# Patient Record
Sex: Female | Born: 1975
Health system: Southern US, Community
[De-identification: ages and names within clinical notes are randomized; demographics above are authoritative.]

## PROBLEM LIST (undated history)

## (undated) DIAGNOSIS — D219 Benign neoplasm of connective and other soft tissue, unspecified: Secondary | ICD-10-CM

## (undated) DIAGNOSIS — M0520 Rheumatoid vasculitis with rheumatoid arthritis of unspecified site: Secondary | ICD-10-CM

## (undated) DIAGNOSIS — Z8669 Personal history of other diseases of the nervous system and sense organs: Secondary | ICD-10-CM

## (undated) DIAGNOSIS — N83209 Unspecified ovarian cyst, unspecified side: Secondary | ICD-10-CM

## (undated) HISTORY — DX: Benign neoplasm of connective and other soft tissue, unspecified: D21.9

## (undated) HISTORY — DX: Rheumatoid vasculitis with rheumatoid arthritis of unspecified site: M05.20

## (undated) HISTORY — DX: Unspecified ovarian cyst, unspecified side: N83.209

## (undated) HISTORY — DX: Personal history of other diseases of the nervous system and sense organs: Z86.69

## (undated) HISTORY — PX: WISDOM TOOTH EXTRACTION: SHX21

## (undated) HISTORY — PX: COLPOSCOPY: SHX161

---

## 2014-04-20 DIAGNOSIS — Z79899 Other long term (current) drug therapy: Secondary | ICD-10-CM | POA: Insufficient documentation

## 2014-04-20 DIAGNOSIS — M0579 Rheumatoid arthritis with rheumatoid factor of multiple sites without organ or systems involvement: Secondary | ICD-10-CM | POA: Insufficient documentation

## 2014-05-07 HISTORY — PX: INTRAUTERINE DEVICE (IUD) INSERTION: SHX5877

## 2015-01-05 ENCOUNTER — Telehealth: Payer: Self-pay | Admitting: Obstetrics and Gynecology

## 2015-01-05 ENCOUNTER — Ambulatory Visit (INDEPENDENT_AMBULATORY_CARE_PROVIDER_SITE_OTHER): Payer: PRIVATE HEALTH INSURANCE | Admitting: Obstetrics and Gynecology

## 2015-01-05 ENCOUNTER — Encounter: Payer: Self-pay | Admitting: Obstetrics and Gynecology

## 2015-01-05 VITALS — BP 114/72 | HR 64 | Resp 15 | Ht 68.0 in | Wt 185.0 lb

## 2015-01-05 DIAGNOSIS — Z3009 Encounter for other general counseling and advice on contraception: Secondary | ICD-10-CM

## 2015-01-05 DIAGNOSIS — D259 Leiomyoma of uterus, unspecified: Secondary | ICD-10-CM | POA: Diagnosis not present

## 2015-01-05 DIAGNOSIS — G43109 Migraine with aura, not intractable, without status migrainosus: Secondary | ICD-10-CM | POA: Insufficient documentation

## 2015-01-05 DIAGNOSIS — Z01419 Encounter for gynecological examination (general) (routine) without abnormal findings: Secondary | ICD-10-CM

## 2015-01-05 DIAGNOSIS — Z124 Encounter for screening for malignant neoplasm of cervix: Secondary | ICD-10-CM

## 2015-01-05 DIAGNOSIS — Z803 Family history of malignant neoplasm of breast: Secondary | ICD-10-CM

## 2015-01-05 DIAGNOSIS — Z800 Family history of malignant neoplasm of digestive organs: Secondary | ICD-10-CM | POA: Diagnosis not present

## 2015-01-05 DIAGNOSIS — D219 Benign neoplasm of connective and other soft tissue, unspecified: Secondary | ICD-10-CM | POA: Insufficient documentation

## 2015-01-05 MED ORDER — MISOPROSTOL 200 MCG PO TABS: ORAL_TABLET | ORAL | Status: DC

## 2015-01-05 NOTE — Telephone Encounter (Signed)
Call to patient to review benefit information for IUD insertion. Patient agreeable. Will call back on 9/7 or 9/8 when her cycle begins to schedule appointment.   Routing to provider for final review. Patient agreeable to disposition. Will close encounter.

## 2015-01-05 NOTE — Patient Instructions (Signed)
EXERCISE AND DIET:  We recommended that you start or continue a regular exercise program for good health. Regular exercise means any activity that makes your heart beat faster and makes you sweat.  We recommend exercising at least 30 minutes per day at least 3 days a week, preferably 4 or 5.  We also recommend a diet low in fat and sugar.  Inactivity, poor dietary choices and obesity can cause diabetes, heart attack, stroke, and kidney damage, among others.    ALCOHOL AND SMOKING:  Women should limit their alcohol intake to no more than 7 drinks/beers/glasses of wine (combined, not each!) per week. Moderation of alcohol intake to this level decreases your risk of breast cancer and liver damage. And of course, no recreational drugs are part of a healthy lifestyle.  And absolutely no smoking or even second hand smoke. Most people know smoking can cause heart and lung diseases, but did you know it also contributes to weakening of your bones? Aging of your skin?  Yellowing of your teeth and nails?  CALCIUM AND VITAMIN D:  Adequate intake of calcium and Vitamin D are recommended.  The recommendations for exact amounts of these supplements seem to change often, but generally speaking 600 mg of calcium (either carbonate or citrate) and 800 units of Vitamin D per day seems prudent. Certain women may benefit from higher intake of Vitamin D.  If you are among these women, your doctor will have told you during your visit.    PAP SMEARS:  Pap smears, to check for cervical cancer or precancers,  have traditionally been done yearly, although recent scientific advances have shown that most women can have pap smears less often.  However, every woman still should have a physical exam from her gynecologist every year. It will include a breast check, inspection of the vulva and vagina to check for abnormal growths or skin changes, a visual exam of the cervix, and then an exam to evaluate the size and shape of the uterus and  ovaries.  And after 38 years of age, a rectal exam is indicated to check for rectal cancers. We will also provide age appropriate advice regarding health maintenance, like when you should have certain vaccines, screening for sexually transmitted diseases, bone density testing, colonoscopy, mammograms, etc.   MAMMOGRAMS:  All women over 40 years old should have a yearly mammogram. Many facilities now offer a "3D" mammogram, which may cost around $50 extra out of pocket. If possible,  we recommend you accept the option to have the 3D mammogram performed.  It both reduces the number of women who will be called back for extra views which then turn out to be normal, and it is better than the routine mammogram at detecting truly abnormal areas.    COLONOSCOPY:  Colonoscopy to screen for colon cancer is recommended for all women at age 50.  We know, you hate the idea of the prep.  We agree, BUT, having colon cancer and not knowing it is worse!!  Colon cancer so often starts as a polyp that can be seen and removed at colonscopy, which can quite literally save your life!  And if your first colonoscopy is normal and you have no family history of colon cancer, most women don't have to have it again for 10 years.  Once every ten years, you can do something that may end up saving your life, right?  We will be happy to help you get it scheduled when you are ready.    Be sure to check your insurance coverage so you understand how much it will cost.  It may be covered as a preventative service at no cost, but you should check your particular policy.     Intrauterine Device Insertion Most often, an intrauterine device (IUD) is inserted into the uterus to prevent pregnancy. There are 2 types of IUDs available:  Copper IUD--This type of IUD creates an environment that is not favorable to sperm survival. The mechanism of action of the copper IUD is not known for certain. It can stay in place for 10 years.  Hormone IUD--This  type of IUD contains the hormone progestin (synthetic progesterone). The progestin thickens the cervical mucus and prevents sperm from entering the uterus, and it also thins the uterine lining. There is no evidence that the hormone IUD prevents implantation. One hormone IUD can stay in place for up to 5 years, and a different hormone IUD can stay in place for up to 3 years. An IUD is the most cost-effective birth control if left in place for the full duration. It may be removed at any time. LET YOUR HEALTH CARE PROVIDER KNOW ABOUT:  Any allergies you have.  All medicines you are taking, including vitamins, herbs, eye drops, creams, and over-the-counter medicines.  Previous problems you or members of your family have had with the use of anesthetics.  Any blood disorders you have.  Previous surgeries you have had.  Possibility of pregnancy.  Medical conditions you have. RISKS AND COMPLICATIONS  Generally, intrauterine device insertion is a safe procedure. However, as with any procedure, complications can occur. Possible complications include:  Accidental puncture (perforation) of the uterus.  Accidental placement of the IUD either in the muscle layer of the uterus (myometrium) or outside the uterus. If this happens, the IUD can be found essentially floating around the bowels and must be taken out surgically.  The IUD may fall out of the uterus (expulsion). This is more common in women who have recently had a child.   Pregnancy in the fallopian tube (ectopic).  Pelvic inflammatory disease (PID), which is infection of the uterus and fallopian tubes. The risk of PID is slightly increased in the first 20 days after the IUD is placed, but the overall risk is still very low. BEFORE THE PROCEDURE  Schedule the IUD insertion for when you will have your menstrual period or right after, to make sure you are not pregnant. Placement of the IUD is better tolerated shortly after a menstrual  cycle.  You may need to take tests or be examined to make sure you are not pregnant.  You may be required to take a pregnancy test.  You may be required to get checked for sexually transmitted infections (STIs) prior to placement. Placing an IUD in someone who has an infection can make the infection worse.  You may be given a pain reliever to take 1 or 2 hours before the procedure.  An exam will be performed to determine the size and position of your uterus.  Ask your health care provider about changing or stopping your regular medicines. PROCEDURE   A tool (speculum) is placed in the vagina. This allows your health care provider to see the lower part of the uterus (cervix).  The cervix is prepped with a medicine that lowers the risk of infection.  You may be given a medicine to numb each side of the cervix (intracervical or paracervical block). This is used to block and control any discomfort   with insertion.  A tool (uterine sound) is inserted into the uterus to determine the length of the uterine cavity and the direction the uterus may be tilted.  A slim instrument (IUD inserter) is inserted through the cervical canal and into your uterus.  The IUD is placed in the uterine cavity and the insertion device is removed.  The nylon string that is attached to the IUD and used for eventual IUD removal is trimmed. It is trimmed so that it lays high in the vagina, just outside the cervix. AFTER THE PROCEDURE  You may have bleeding after the procedure. This is normal. It varies from light spotting for a few days to menstrual-like bleeding.  You may have mild cramping. Document Released: 12/20/2010 Document Revised: 02/11/2013 Document Reviewed: 10/12/2012 Exit[employer] Patient Information 2015 [employer]. This information is not intended to replace advice given to you by your health care provider. Make sure you discuss any questions you have with your health care provider.  

## 2015-01-05 NOTE — Progress Notes (Signed)
Patient ID: Elizabeth Swanson, female   DOB: 1976-03-21, 39 y.o.   MRN: [id number] 39 y.o. G0P0000 SingleCaucasianF here for annual exam.  The patient is on the nuvaring, she forgot to replace it once. Negative UPT, restarted with her next cycle.  Menses q month x 4 days. Saturates a pad 2-3 x a day (heavy prior to birth control). Cramps are mild (worse without birth control).  Sexually active, same partner x 2 years. No plans to have children.  She was incidentally noted to have a small fibroid at the time of evaluation of a hemorrhagic cyst. She states the cyst never completely went away, was complex, about 2 cm. Not sure if she was supposed to have another ultrasound.   Patient's last menstrual period was 12/13/2014.          Sexually active: Yes.    The current method of family planning is NuvaRing vaginal inserts.    Exercising: Yes.    walk, jog  Smoker:  no  Health Maintenance: Pap:  2013 WNL per patient  History of abnormal Pap:  Yes, in 2000-colposcopy - normal, f/u was normal.  MMG:  10 yrs ago- WNL Colonoscopy:  10 years ago [facility] BMD:   13 years ago, she used to be on a lot of steroids because of her arthritis.  TDaP:  Up to date.    reports that she has never smoked. She has never used smokeless tobacco. She reports that she drinks about 1.2 - 1.8 oz of alcohol per week. She reports that she does not use illicit drugs.  Past Medical History  Diagnosis Date  . Fibroid   . Ovarian cyst   . Rheumatoid arteritis     Past Surgical History  Procedure Laterality Date  . Colposcopy    . Wisdom tooth extraction      Current Outpatient Prescriptions  Medication Sig Dispense Refill  . Certolizumab Pegol (CIMZIA [place]) Inject 200 mLs into the skin. Every other week    . etonogestrel-ethinyl estradiol (NUVARING) 0.12-0.015 MG/24HR vaginal ring Place 1 each vaginally every 28 (twenty-eight) days. Insert vaginally and leave in place for 3 consecutive weeks, then remove for 1 week.    Marland Kitchen FOLIC  ACID PO Take by mouth.    . methotrexate (RHEUMATREX) 10 MG tablet Take 10 mg by mouth once a week. Caution: Chemotherapy. Protect from light.     No current facility-administered medications for this visit.    Family History  Problem Relation Age of Onset  . Cancer Mother     gallbladder  . Colon cancer Father   . Breast cancer Sister   . Seizures Paternal Uncle   . Breast cancer Maternal Grandmother   . Colon cancer Paternal Grandfather    Sister with breast cancer at 37, negative BRCA testing, Father with colon cancer at 88, PGF with colon cancer in his 77's. MGM with breast cancer in her 18's, died in her 44's.  ROS:  Pertinent items are noted in HPI.  Otherwise, a comprehensive ROS was negative.  SH: lives with her boyfriend. 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:   BP 114/72 mmHg  Pulse 64  Resp 15  Ht 5' 8"  (1.727 m)  Wt 185 lb (83.915 kg)  BMI 28.14 kg/m2  LMP 12/13/2014  Weight change: @WEIGHTCHANGE @ Height:   Height: 5' 8"  (172.7 cm)  Ht Readings from Last 3 Encounters:  01/05/15 5' 8"  (1.727 m)    General appearance: alert, cooperative and appears stated  age Head: Normocephalic, without obvious abnormality, atraumatic Neck: no adenopathy, supple, symmetrical, trachea midline and thyroid normal to inspection and palpation Lungs: clear to auscultation bilaterally Breasts: normal appearance, no masses or tenderness Heart: regular rate and rhythm Abdomen: soft, non-tender; bowel sounds normal; no masses,  no organomegaly Extremities: extremities normal, atraumatic, no cyanosis or edema Skin: Skin color, texture, turgor normal. No rashes or lesions Lymph nodes: Cervical, supraclavicular, and axillary nodes normal. No abnormal inguinal nodes palpated Neurologic: Grossly normal   Pelvic: External genitalia:  no lesions              Urethra:  normal appearing urethra with no masses, tenderness or lesions              Bartholins and Skenes: normal                  Vagina: normal appearing vagina with normal color and discharge, no lesions              Cervix: no lesions              Pap taken: Yes.   Bimanual Exam:  Uterus:  normal size, contour, position, consistency, mobility, non-tender and anteverted              Adnexa: normal adnexa and no mass, fullness, tenderness               Rectovaginal: Confirms               Anus:  normal sphincter tone, no lesions  Chaperone was present for exam.  A:  Well Woman with normal exam  Migraines with aura  FH of breast cancer  FH of colon cancer  Contraception management  H/O ovarian cyst  P:   Mammogram, screening (FH sister with breast cancer at 46)  She isn't comfortable with breast self exam  Pap with hpv  She will set up an appointment with her primary  Labs with primary and rheumatology  Get a copy of her records (h/o ovarian cyst)  She shouldn't been on the nuvaring with her h/o auras  Discussed contraception, interested in getting a Mirena IUD, will pre treat with cytotec  Colonoscopy at 44

## 2015-01-07 ENCOUNTER — Telehealth: Payer: Self-pay | Admitting: Obstetrics and Gynecology

## 2015-01-07 DIAGNOSIS — N83201 Unspecified ovarian cyst, right side: Secondary | ICD-10-CM

## 2015-01-07 LAB — IPS PAP TEST WITH HPV

## 2015-01-07 NOTE — Telephone Encounter (Signed)
Old record reviewed. She was followed for a complex right ovarian cyst from 4/14-7/15. The cyst was originally 3.7 cm, down to 2.8 on f/u scans. Both cystic and solid components. It was decided to stop scanning her because the cyst was stable. I would recommend she have another ultrasound. Please inform the patient. I will order it.

## 2015-01-11 ENCOUNTER — Telehealth: Payer: Self-pay | Admitting: Obstetrics and Gynecology

## 2015-01-11 NOTE — Telephone Encounter (Signed)
Please see if she can come in for an ultrasound today. If not, I would go ahead with the IUD insertion and do the ultrasound in the next few weeks.

## 2015-01-11 NOTE — Telephone Encounter (Signed)
Patient calling to schedule her IUD. She started her cycle on 01/08/15.

## 2015-01-11 NOTE — Telephone Encounter (Signed)
Spoke with patient. Patient states that she has to be at work in 20 minutes and is unable to be seen for PUS today. Would like to proceed with IUD insertion tomorrow and schedule follow up PUS at appointment tomorrow. Advised will let Dr.Jertson know and will see her tomorrow at 1 pm for IUD insertion. Patient is agreeable.  Routing to provider for final review. Patient agreeable to disposition. Will close encounter.

## 2015-01-11 NOTE — Telephone Encounter (Signed)
I spoke with patient and let her know that Dr. Talbert Nan has recommended she have a U/S and has ordered it. She voiced understanding and agreed to this plan -eh

## 2015-01-11 NOTE — Telephone Encounter (Signed)
Spoke with patient. Patient states that she started her cycle on 01/08/2015 and would like to schedule her IUD insertion at this time. IUD insertion scheduled for 01/12/2015 at 1 pm with Dr.Jertson. Patient is agreeable to date and time. Pre procedure instructions given.  Motrin instructions given. Motrin=Advil=Ibuprofen, 800 mg one hour before appointment. Eat a meal and hydrate well before appointment. Cytotec instructions given. Place 2 tablets PV ( 400 mcg) 6-12 hours prior to procedure. Patient is agreeable. Rx was previously sent in be Dr.Jertson. Patient states that she received a call this morning recommending that she have a follow up PUS. Patient wants to make sure she is okay to have her IUD placed before her follow up PUS. Advised I will speak with Dr.Jertson and return call. Patient is agreeable.

## 2015-01-12 ENCOUNTER — Ambulatory Visit (INDEPENDENT_AMBULATORY_CARE_PROVIDER_SITE_OTHER): Payer: PRIVATE HEALTH INSURANCE | Admitting: Obstetrics and Gynecology

## 2015-01-12 ENCOUNTER — Encounter: Payer: Self-pay | Admitting: Obstetrics and Gynecology

## 2015-01-12 VITALS — BP 110/78 | HR 68 | Resp 14 | Wt 187.0 lb

## 2015-01-12 DIAGNOSIS — Z01812 Encounter for preprocedural laboratory examination: Secondary | ICD-10-CM

## 2015-01-12 DIAGNOSIS — Z3009 Encounter for other general counseling and advice on contraception: Secondary | ICD-10-CM

## 2015-01-12 DIAGNOSIS — Z30430 Encounter for insertion of intrauterine contraceptive device: Secondary | ICD-10-CM

## 2015-01-12 DIAGNOSIS — N83201 Unspecified ovarian cyst, right side: Secondary | ICD-10-CM

## 2015-01-12 LAB — POCT URINE PREGNANCY: Preg Test, Ur: NEGATIVE

## 2015-01-12 MED ORDER — LEVONORGESTREL 20 MCG/24HR IU IUD: 1.0000 | INTRAUTERINE_SYSTEM | Freq: Once | INTRAUTERINE | Status: AC

## 2015-01-12 NOTE — Patient Instructions (Signed)

## 2015-01-12 NOTE — Progress Notes (Signed)
Patient ID: Elizabeth Swanson, female   DOB: 06-Jan-1976, 39 y.o.   MRN: [id number] GYNECOLOGY  VISIT   HPI: 39 y.o.   Single  Caucasian  female   G0P0000 with Patient's last menstrual period was 01/08/2015.   here for IUD (Mirena) insertion. The patient has a h/o an ovarian cyst, old records were reviewed, I recommended she return for a gyn ultrasound, not scheduled yet.   GYNECOLOGIC HISTORY: Patient's last menstrual period was 01/08/2015. Contraception:Nuva ring Menopausal hormone therapy: N/A        OB History    Gravida Para Term Preterm AB TAB SAB Ectopic Multiple Living   0 0 0 0 0 0 0 0 0 0          Patient Active Problem List   Diagnosis Date Noted  . Migraine with aura 01/05/2015  . Family history of breast cancer 01/05/2015  . Family history of colon cancer 01/05/2015  . Rheumatoid arteritis   . Fibroid     Past Medical History  Diagnosis Date  . Fibroid   . Ovarian cyst   . Rheumatoid arteritis     Past Surgical History  Procedure Laterality Date  . Colposcopy    . Wisdom tooth extraction      Current Outpatient Prescriptions  Medication Sig Dispense Refill  . Certolizumab Pegol (CIMZIA [place]) Inject 200 mLs into the skin. Every other week    . etonogestrel-ethinyl estradiol (NUVARING) 0.12-0.015 MG/24HR vaginal ring Place 1 each vaginally every 28 (twenty-eight) days. Insert vaginally and leave in place for 3 consecutive weeks, then remove for 1 week.    Marland Kitchen FOLIC ACID PO Take by mouth.    . methotrexate (RHEUMATREX) 10 MG tablet Take 10 mg by mouth once a week. Caution: Chemotherapy. Protect from light.    . misoprostol (CYTOTEC) 200 MCG tablet Place 2 tablets per vagina 6-12 hours prior to IUD insertion 2 tablet 0   No current facility-administered medications for this visit.     ALLERGIES: Enbrel and Amoxicillin  Family History  Problem Relation Age of Onset  . Cancer Mother     gallbladder  . Colon cancer Father 67  . Breast cancer Sister 27  .  Seizures Paternal Uncle   . Breast cancer Maternal Grandmother   . Colon cancer Paternal Grandfather 20    Social History   Social History  . Marital Status: Single    Spouse Name: N/A  . Number of Children: N/A  . Years of Education: N/A   Occupational History  . Not on file.   Social History Main Topics  . Smoking status: Never Smoker   . Smokeless tobacco: Never Used  . Alcohol Use: 1.2 - 1.8 oz/week    2-3 Standard drinks or equivalent per week  . Drug Use: No  . Sexual Activity:    Partners: Male   Other Topics Concern  . Not on file   Social History Narrative   The patient is an ER MD for [employer]    Review of Systems  Constitutional: Negative.   HENT: Negative.   Eyes: Negative.   Respiratory: Negative.   Cardiovascular: Negative.   Gastrointestinal: Negative.   Genitourinary: Negative.   Musculoskeletal: Negative.   Skin: Negative.   Neurological: Negative.   Endo/Heme/Allergies: Negative.   Psychiatric/Behavioral: Negative.     PHYSICAL EXAMINATION:    BP 110/78 mmHg  Pulse 68  Resp 14  Wt 187 lb (84.823 kg)  LMP 01/08/2015    General appearance: alert, cooperative  and appears stated age  Pelvic: External genitalia:  no lesions              Urethra:  normal appearing urethra with no masses, tenderness or lesions              Bartholins and Skenes: normal                 Vagina: normal appearing vagina with normal color and discharge, no lesions              Cervix: no lesions                The risks of the mirena IUD were reviewed with the patient, including infection, abnormal bleeding and uterine perfortion. Consent was signed.  A speculum was placed in the vagina, the cervix was cleansed with betadine. A tenaculum was placed on the cervix, the uterus sounded to 7 cm. The cervix was dilated to a #5 hagar dilator  The mirena IUD was inserted without difficulty. The string were cut to 3-4 cm. The tenaculum was removed. The patient tolerated  the procedure well.   Chaperone was present for exam.  ASSESSMENT   IUD insertion Ovarian cyst, reviewed old records, complex 2.8 cm at last check (over a year ago)  PLAN UPT negative IUD inserted, use condoms x 1 week  F/U in one month for an IUD check, call with any concerns She will return sooner for a f/u ultrasound, h/o complex ovarian cyst An After Visit Summary was printed and given to the patient.

## 2015-01-13 ENCOUNTER — Telehealth: Payer: Self-pay | Admitting: Obstetrics and Gynecology

## 2015-01-13 NOTE — Telephone Encounter (Signed)
Call to patient to discuss insurance benefits for ultrasound and to schedule ultrasound that needs to be scheduled within 2 weeks from today per Dr. Talbert Nan. Left patient voicemail to return my call to discuss.

## 2015-01-13 NOTE — Telephone Encounter (Signed)
Spoke with patient. Reviewed benefit. Patient understands and agreeable. Patient aware of 72 hour cancellation policy and $412 fee. Patient preferred to schedule on 01/25/15 due to another doctor appointment the previous week. Patient agreeable to date and time. Ok to close.

## 2015-01-25 ENCOUNTER — Ambulatory Visit (INDEPENDENT_AMBULATORY_CARE_PROVIDER_SITE_OTHER): Payer: PRIVATE HEALTH INSURANCE

## 2015-01-25 ENCOUNTER — Ambulatory Visit (INDEPENDENT_AMBULATORY_CARE_PROVIDER_SITE_OTHER): Payer: PRIVATE HEALTH INSURANCE | Admitting: Obstetrics and Gynecology

## 2015-01-25 VITALS — BP 110/78 | HR 64 | Resp 14 | Wt 190.0 lb

## 2015-01-25 DIAGNOSIS — Z9889 Other specified postprocedural states: Secondary | ICD-10-CM | POA: Diagnosis not present

## 2015-01-25 DIAGNOSIS — Z8742 Personal history of other diseases of the female genital tract: Secondary | ICD-10-CM

## 2015-01-25 DIAGNOSIS — N83201 Unspecified ovarian cyst, right side: Secondary | ICD-10-CM

## 2015-01-25 DIAGNOSIS — Z30431 Encounter for routine checking of intrauterine contraceptive device: Secondary | ICD-10-CM | POA: Diagnosis not present

## 2015-01-25 DIAGNOSIS — N8320 Unspecified ovarian cysts: Secondary | ICD-10-CM

## 2015-01-25 DIAGNOSIS — Z803 Family history of malignant neoplasm of breast: Secondary | ICD-10-CM | POA: Diagnosis not present

## 2015-01-25 NOTE — Progress Notes (Signed)
    The patient has a h/o a complex right ovarian cyst last year, her for f/u and for an IUD check. Mirena IUD placed a few weeks ago. She states since the mirena was placed she has had some mild cramping, spotting, some moodiness and weight gain.  O: Alert, caucasian female in NAD Blood pressure 110/78, pulse 64, resp. rate 14, weight 190 lb (86.183 kg), last menstrual period 01/08/2015. Pelvic: normal external genitalia, normal vaginal mucosa, cervix without lesions, IUD string 2-3 cm.  BM exam: anteverted, normal sized uterus, not tender, no adnexal masses or tenderness  U/S normal uterus and adnexa, IUD in place.  A/P 1) h/o ovarian cyst, resolved 2) IUD check, IUD in place, mild c/o, tolerable to her  Routine f/u  We need to set up her mammogram, discussed at her annual exam. +FH of breast cancer in her sister at 46.

## 2015-01-31 ENCOUNTER — Telehealth: Payer: Self-pay | Admitting: Emergency Medicine

## 2015-01-31 NOTE — Telephone Encounter (Signed)
Call to patient. She would like to call and schedule this mammogram on her own due to her work schedule.  She is given number [facility] imaging and will call to schedule mammogram.  Routing to provider for final review. Patient agreeable to disposition. Will close encounter.

## 2015-01-31 NOTE — Telephone Encounter (Signed)
-----   Message from Elenore Paddy sent at 01/25/2015  4:35 PM EDT -----   ----- Message -----    From: Salvadore Dom, MD    Sent: 01/25/2015   4:11 PM      To: Elenore Paddy  Because of this patient's age, can you please set her up for a screening mammogram, bilateral. FH breast cancer in her sister at 69. Thanks

## 2015-02-07 ENCOUNTER — Ambulatory Visit: Payer: PRIVATE HEALTH INSURANCE | Admitting: Obstetrics and Gynecology

## 2015-06-09 ENCOUNTER — Ambulatory Visit
Admission: RE | Admit: 2015-06-09 | Discharge: 2015-06-09 | Disposition: A | Discharge status: Home / Self Care | Payer: PRIVATE HEALTH INSURANCE | Source: Ambulatory Visit | Attending: Obstetrics and Gynecology | Admitting: Obstetrics and Gynecology

## 2015-06-09 DIAGNOSIS — Z803 Family history of malignant neoplasm of breast: Secondary | ICD-10-CM

## 2016-01-25 ENCOUNTER — Ambulatory Visit: Payer: PRIVATE HEALTH INSURANCE | Admitting: Obstetrics and Gynecology

## 2016-01-30 ENCOUNTER — Ambulatory Visit (INDEPENDENT_AMBULATORY_CARE_PROVIDER_SITE_OTHER): Payer: PRIVATE HEALTH INSURANCE | Admitting: Obstetrics and Gynecology

## 2016-01-30 ENCOUNTER — Encounter: Payer: Self-pay | Admitting: Obstetrics and Gynecology

## 2016-01-30 VITALS — BP 110/62 | HR 80 | Resp 13 | Ht 68.25 in | Wt 184.0 lb

## 2016-01-30 DIAGNOSIS — Z01419 Encounter for gynecological examination (general) (routine) without abnormal findings: Secondary | ICD-10-CM | POA: Diagnosis not present

## 2016-01-30 DIAGNOSIS — Z803 Family history of malignant neoplasm of breast: Secondary | ICD-10-CM | POA: Diagnosis not present

## 2016-01-30 DIAGNOSIS — Z800 Family history of malignant neoplasm of digestive organs: Secondary | ICD-10-CM

## 2016-01-30 DIAGNOSIS — Z30431 Encounter for routine checking of intrauterine contraceptive device: Secondary | ICD-10-CM

## 2016-01-30 NOTE — Progress Notes (Signed)
40 y.o. G0P0000 SingleCaucasianF here for annual exam.  She c/o intermittent pelvic pain since the IUD was inserted, better for the last 2 months. When the pain comes it's in the region of the pubic symphysis. Heating pad helps, wakes her from sleep. Can be intermittent for a day or so.   IUD was in place on ultrasound at the end on 9/16.  Sexually active, no pain with intercourse. No cycles. Just occasional spotting. Same partner, live together.  She is changing her medication for her Rheumatoid Arthritis, immunomodulator. Takes a SQ shot every other week, waiting for the new medication. Still having joint stiffness and mild pain.     No LMP recorded (lmp unknown). Patient is not currently having periods (Reason: IUD).          Sexually active: Yes.    The current method of family planning is IUD.  Mirena placed in 9/16.  Exercising: Yes.    walking/ hiking Smoker:  no  Health Maintenance: Pap:  01-05-15 WNL NEG HR HPV History of abnormal Pap:  yes MMG:  06-10-15 WNL  Colonoscopy:  Never BMD:   Never TDaP:  Up to date  Gardasil: N/A   reports that she has never smoked. She has never used smokeless tobacco. She reports that she drinks about 1.2 - 1.8 oz of alcohol per week . She reports that she does not use drugs.ER MD  Past Medical History:  Diagnosis Date  . Fibroid   . Ovarian cyst   . Rheumatoid arteritis     Past Surgical History:  Procedure Laterality Date  . COLPOSCOPY    . WISDOM TOOTH EXTRACTION      Current Outpatient Prescriptions  Medication Sig Dispense Refill  . levonorgestrel (MIRENA) 20 MCG/24HR IUD 1 Intra Uterine Device (1 each total) by Intrauterine route once. 1 each 0   No current facility-administered medications for this visit.     Family History  Problem Relation Age of Onset  . Cancer Mother     gallbladder  . Colon cancer Father 53  . Breast cancer Sister 31  . Seizures Paternal Uncle   . Breast cancer Maternal Grandmother   . Colon cancer  Paternal Grandfather 80  Sister with breast cancer at 31, negative BRCA testing, Father with colon cancer at 53, PGF with colon cancer in his 80's. MGM with breast cancer in her 50's, died in her 60's.  Review of Systems  Constitutional: Negative.   HENT: Negative.   Eyes: Negative.   Respiratory: Negative.   Cardiovascular: Negative.   Gastrointestinal: Negative.   Endocrine: Negative.   Genitourinary: Negative.   Musculoskeletal: Negative.   Skin: Negative.   Allergic/Immunologic: Negative.   Neurological: Negative.   Psychiatric/Behavioral: Negative.     Exam:   BP 110/62 (BP Location: Right Arm, Patient Position: Sitting, Cuff Size: Normal)   Pulse 80   Resp 13   Ht 5' 8.25" (1.734 m)   Wt 184 lb (83.5 kg)   LMP  (LMP Unknown)   BMI 27.77 kg/m   Weight change: @WEIGHTCHANGE@ Height:   Height: 5' 8.25" (173.4 cm)  Ht Readings from Last 3 Encounters:  01/30/16 5' 8.25" (1.734 m)  01/05/15 5' 8" (1.727 m)    General appearance: alert, cooperative and appears stated age Head: Normocephalic, without obvious abnormality, atraumatic Neck: no adenopathy, supple, symmetrical, trachea midline and thyroid normal to inspection and palpation Lungs: clear to auscultation bilaterally Breasts: normal appearance, no masses or tenderness Heart: regular rate and rhythm   Abdomen: soft, non-tender; bowel sounds normal; no masses,  no organomegaly Extremities: extremities normal, atraumatic, no cyanosis or edema Skin: Skin color, texture, turgor normal. No rashes or lesions Lymph nodes: Cervical, supraclavicular, and axillary nodes normal. No abnormal inguinal nodes palpated Neurologic: Grossly normal   Pelvic: External genitalia:  no lesions              Urethra:  normal appearing urethra with no masses, tenderness or lesions              Bartholins and Skenes: normal                 Vagina: normal appearing vagina with normal color and discharge, no lesions              Cervix: no  lesions, IUD string 3 cm               Bimanual Exam:  Uterus:  normal size, contour, position, consistency, mobility, non-tender              Adnexa: no mass, fullness, tenderness               Rectovaginal: Confirms               Anus:  normal sphincter tone, no lesions  Chaperone was present for exam.  A:  Well Woman with normal exam  Sister with breast cancer at 14, negative BRCA  Father with colon cancer at 74  P:   No pap this year  Mammogram end of February  Colonoscopy starting at 70  Discussed breast self exam  Labs with her Rheumatologist  Discussed calcium and vit D

## 2016-01-30 NOTE — Patient Instructions (Signed)

## 2016-06-14 ENCOUNTER — Other Ambulatory Visit
Admission: RE | Admit: 2016-06-14 | Discharge: 2016-06-14 | Disposition: A | Discharge status: Home / Self Care | Payer: PRIVATE HEALTH INSURANCE | Source: Ambulatory Visit | Attending: Internal Medicine | Admitting: Internal Medicine

## 2016-06-14 DIAGNOSIS — M0579 Rheumatoid arthritis with rheumatoid factor of multiple sites without organ or systems involvement: Secondary | ICD-10-CM | POA: Diagnosis not present

## 2016-06-14 DIAGNOSIS — Z79899 Other long term (current) drug therapy: Secondary | ICD-10-CM | POA: Insufficient documentation

## 2016-06-14 LAB — CBC
HCT: 41.1 % (ref 35.0–47.0)
Hemoglobin: 14.2 g/dL (ref 12.0–16.0)
MCH: 32 pg (ref 26.0–34.0)
MCHC: 34.4 g/dL (ref 32.0–36.0)
MCV: 93.1 fL (ref 80.0–100.0)
PLATELETS: 188 10*3/uL (ref 150–440)
RBC: 4.42 MIL/uL (ref 3.80–5.20)
RDW: 12.6 % (ref 11.5–14.5)
WBC: 4.2 10*3/uL (ref 3.6–11.0)

## 2016-06-14 LAB — HEPATIC FUNCTION PANEL
ALBUMIN: 4.2 g/dL (ref 3.5–5.0)
ALT: 18 U/L (ref 14–54)
AST: 21 U/L (ref 15–41)
Alkaline Phosphatase: 44 U/L (ref 38–126)
BILIRUBIN DIRECT: 0.1 mg/dL (ref 0.1–0.5)
Indirect Bilirubin: 0.7 mg/dL (ref 0.3–0.9)
Total Bilirubin: 0.8 mg/dL (ref 0.3–1.2)
Total Protein: 6.7 g/dL (ref 6.5–8.1)

## 2016-06-14 LAB — CREATININE, SERUM
CREATININE: 0.72 mg/dL (ref 0.44–1.00)
GFR calc Af Amer: >60 mL/min (ref >60)
GFR calc non Af Amer: >60 mL/min (ref >60)

## 2016-07-25 ENCOUNTER — Other Ambulatory Visit
Admission: RE | Admit: 2016-07-25 | Discharge: 2016-07-25 | Disposition: A | Discharge status: Home / Self Care | Payer: PRIVATE HEALTH INSURANCE | Source: Ambulatory Visit | Attending: Internal Medicine | Admitting: Internal Medicine

## 2016-07-25 DIAGNOSIS — Z79899 Other long term (current) drug therapy: Secondary | ICD-10-CM | POA: Diagnosis not present

## 2016-07-25 DIAGNOSIS — M0579 Rheumatoid arthritis with rheumatoid factor of multiple sites without organ or systems involvement: Secondary | ICD-10-CM | POA: Insufficient documentation

## 2016-07-25 LAB — HEPATIC FUNCTION PANEL
ALT: 43 U/L (ref 14–54)
AST: 27 U/L (ref 15–41)
Albumin: 4.1 g/dL (ref 3.5–5.0)
Alkaline Phosphatase: 63 U/L (ref 38–126)
BILIRUBIN DIRECT: 0.1 mg/dL (ref 0.1–0.5)
Indirect Bilirubin: 1 mg/dL — ABNORMAL HIGH (ref 0.3–0.9)
Total Bilirubin: 1.1 mg/dL (ref 0.3–1.2)
Total Protein: 6.4 g/dL — ABNORMAL LOW (ref 6.5–8.1)

## 2016-07-25 LAB — CREATININE, SERUM
Creatinine, Ser: 0.69 mg/dL (ref 0.44–1.00)
GFR calc non Af Amer: >60 mL/min (ref >60)

## 2016-07-25 LAB — CBC
HEMATOCRIT: 37.8 % (ref 35.0–47.0)
Hemoglobin: 13.1 g/dL (ref 12.0–16.0)
MCH: 31.8 pg (ref 26.0–34.0)
MCHC: 34.6 g/dL (ref 32.0–36.0)
MCV: 92.1 fL (ref 80.0–100.0)
Platelets: 210 10*3/uL (ref 150–440)
RBC: 4.1 MIL/uL (ref 3.80–5.20)
RDW: 11.9 % (ref 11.5–14.5)
WBC: 5.9 10*3/uL (ref 3.6–11.0)

## 2016-11-20 DIAGNOSIS — S62618A Displaced fracture of proximal phalanx of other finger, initial encounter for closed fracture: Secondary | ICD-10-CM | POA: Insufficient documentation

## 2017-01-29 ENCOUNTER — Ambulatory Visit (INDEPENDENT_AMBULATORY_CARE_PROVIDER_SITE_OTHER): Payer: PRIVATE HEALTH INSURANCE | Admitting: Internal Medicine

## 2017-01-29 ENCOUNTER — Encounter: Payer: Self-pay | Admitting: Internal Medicine

## 2017-01-29 VITALS — BP 102/68 | HR 76 | Ht 68.0 in | Wt 180.0 lb

## 2017-01-29 DIAGNOSIS — R1310 Dysphagia, unspecified: Secondary | ICD-10-CM | POA: Diagnosis not present

## 2017-01-29 DIAGNOSIS — Z800 Family history of malignant neoplasm of digestive organs: Secondary | ICD-10-CM | POA: Diagnosis not present

## 2017-01-29 DIAGNOSIS — E782 Mixed hyperlipidemia: Secondary | ICD-10-CM | POA: Diagnosis not present

## 2017-01-29 DIAGNOSIS — M0579 Rheumatoid arthritis with rheumatoid factor of multiple sites without organ or systems involvement: Secondary | ICD-10-CM | POA: Diagnosis not present

## 2017-01-29 DIAGNOSIS — R1319 Other dysphagia: Secondary | ICD-10-CM

## 2017-01-29 NOTE — Progress Notes (Signed)
Date:  01/29/2017   Name:  Elizabeth Swanson   DOB:  February 17, 1976   MRN:  [id number]   Chief Complaint: Establish Care (Need PCP- have not had one in last 3 years.  ) Gastroesophageal Reflux  She complains of choking (especially with bread) and heartburn. She reports no abdominal pain, no chest pain or no wheezing. This is a recurrent problem. The problem occurs frequently. The problem has been gradually worsening. Pertinent negatives include no fatigue. She has tried a PPI for the symptoms. The treatment provided moderate relief. Past procedures do not include an EGD or a UGI.   RA - diagnosed as a teenager. She is on multiple medications in the past. Currently on Arava and starting another medication in the near future. Her main joints involved are her hands and wrist and feet.  Hyperlipidemia - has been on medications in the past for RA that apparently had strong effect on lipids. She doesn't recall whether her current medication is the culprit. In any event she has not had lipids checked in several years.  She has a strong family history of both colon and breast cancer. Father was diagnosed with colon cancer at age 53. She states she had a colonoscopy about 15 years ago which was normal. By usual recommendations she is either 3 years early for a colonoscopy or 5 years behind.  Review of Systems  Constitutional: Negative for chills, fatigue and fever.  HENT: Positive for trouble swallowing.   Respiratory: Positive for choking (especially with bread). Negative for chest tightness, shortness of breath and wheezing.   Cardiovascular: Negative for chest pain and palpitations.  Gastrointestinal: Positive for heartburn. Negative for abdominal pain.  Musculoskeletal: Positive for arthralgias.    Patient Active Problem List   Diagnosis Date Noted  . Migraine with aura 01/05/2015  . Family history of breast cancer 01/05/2015  . Family history of colon cancer 01/05/2015  . Fibroid   .  Rheumatoid arthritis involving multiple sites with positive rheumatoid factor ([place]) 04/20/2014  . High risk medication use 04/20/2014    Prior to Admission medications   Medication Sig Start Date End Date Taking? Authorizing Provider  cetirizine (ZYRTEC) 10 MG tablet Take 10 mg by mouth daily.   Yes [provider]  leflunomide (ARAVA) 10 MG tablet Take 10 mg by mouth daily.   Yes [provider]  levonorgestrel (MIRENA) 20 MCG/24HR IUD 1 Intra Uterine Device (1 each total) by Intrauterine route once. 01/12/15  Yes Salvadore Dom, MD  omeprazole (PRILOSEC) 20 MG capsule Take 20 mg by mouth daily.   Yes [provider]    Allergies  Allergen Reactions  . Amoxicillin Rash  . Enbrel [Etanercept] Hives    Past Surgical History:  Procedure Laterality Date  . COLPOSCOPY    . INTRAUTERINE DEVICE (IUD) INSERTION  2016   Mirena   . WISDOM TOOTH EXTRACTION      Social History  Substance Use Topics  . Smoking status: Never Smoker  . Smokeless tobacco: Never Used  . Alcohol use 3.0 - 3.6 oz/week    2 - 3 Standard drinks or equivalent, 3 Cans of beer per week     Medication list has been reviewed and updated.  PHQ 2/9 Scores 01/29/2017  PHQ - 2 Score 0    Physical Exam  Constitutional: She is oriented to person, place, and time. She appears well-developed. No distress.  HENT:  Head: Normocephalic and atraumatic.  Neck: Normal range of motion.  Neck supple. No thyromegaly present.  Cardiovascular: Normal rate, regular rhythm and normal heart sounds.   Pulmonary/Chest: Effort normal and breath sounds normal. No respiratory distress. She has no wheezes.  Musculoskeletal: Normal range of motion. She exhibits edema (trace edema to mid tibia).  Swelling and synovitis with early deformity of the MCP joints on both hands.  Mild swelling of left wrist.  Neurological: She is alert and oriented to person, place, and time.  Skin: Skin is warm and dry. No rash  noted.  Psychiatric: She has a normal mood and affect. Her behavior is normal. Thought content normal.  Nursing note and vitals reviewed.   BP 102/68 (BP Location: Right Arm, Patient Position: Sitting, Cuff Size: Normal)   Pulse 76   Ht 5\' 8"  (1.727 m)   Wt 180 lb (81.6 kg)   SpO2 98%   BMI 27.37 kg/m   Assessment and Plan: 1. Esophageal dysphagia Needs GI evaluation  - TSH - Ambulatory referral to Gastroenterology  2. Rheumatoid arthritis involving multiple sites with positive rheumatoid factor ([place]) Followed by Rheumatology in [place]  3. Family history of colon cancer Should probably have colonoscopy - Ambulatory referral to Gastroenterology  4. Mixed hyperlipidemia Check labs and advise - Lipid panel   No orders of the defined types were placed in this encounter.   Partially dictated using Editor, commissioning. Any errors are unintentional.  Halina Maidens, MD [facility]  01/29/2017

## 2017-01-30 ENCOUNTER — Ambulatory Visit (INDEPENDENT_AMBULATORY_CARE_PROVIDER_SITE_OTHER): Payer: PRIVATE HEALTH INSURANCE | Admitting: Obstetrics and Gynecology

## 2017-01-30 ENCOUNTER — Encounter: Payer: Self-pay | Admitting: Obstetrics and Gynecology

## 2017-01-30 ENCOUNTER — Ambulatory Visit
Admission: RE | Admit: 2017-01-30 | Discharge: 2017-01-30 | Disposition: A | Discharge status: Home / Self Care | Payer: PRIVATE HEALTH INSURANCE | Source: Ambulatory Visit | Attending: Obstetrics and Gynecology | Admitting: Obstetrics and Gynecology

## 2017-01-30 ENCOUNTER — Other Ambulatory Visit: Payer: Self-pay | Admitting: Obstetrics and Gynecology

## 2017-01-30 VITALS — BP 128/70 | HR 72 | Resp 16 | Ht 68.0 in | Wt 181.0 lb

## 2017-01-30 DIAGNOSIS — Z30431 Encounter for routine checking of intrauterine contraceptive device: Secondary | ICD-10-CM

## 2017-01-30 DIAGNOSIS — R102 Pelvic and perineal pain: Secondary | ICD-10-CM

## 2017-01-30 DIAGNOSIS — Z800 Family history of malignant neoplasm of digestive organs: Secondary | ICD-10-CM

## 2017-01-30 DIAGNOSIS — Z1231 Encounter for screening mammogram for malignant neoplasm of breast: Secondary | ICD-10-CM

## 2017-01-30 DIAGNOSIS — Z01419 Encounter for gynecological examination (general) (routine) without abnormal findings: Secondary | ICD-10-CM

## 2017-01-30 NOTE — Progress Notes (Signed)
41 y.o. G0P0000 SingleCaucasianF here for annual exam. Mirena IUD was placed in 9/16. Intermittent spotting. Sexually active, no pain. She still has intermittent pelvic pain. Can come daily for a month, then can go a month without it. The pain is sharp, right above the pubic bone toward the right. Wakes from sleep. Pulsates for a few seconds at a time and comes repeatedly for the day. Makes her feel like she needs to void or have a BM, but doesn't improve the pain. Feels similar to prior cyst pain, less intense and more focal. Not helped with NSAID's. Boyfriend doesn't have health insurance so can't have a vasectomy. Live together, together for four years.  She hasn't been good about taking pills in the past, also forgot about a nuvaring. Feels she would be better with the ring now. She has rare h/o migraines with aura.     No LMP recorded. Patient is not currently having periods (Reason: IUD).          Sexually active: Yes.    The current method of family planning is IUD.    Exercising: Yes.    walking/biking  Smoker:  no  Health Maintenance: Pap:  01-05-15 WNL NEG HR HPV   History of abnormal Pap:  yes MMG:  01-30-17 waiting for results  Colonoscopy:  Never BMD:   Never TDaP:  Up to date Gardasil: N/A   reports that she has never smoked. She has never used smokeless tobacco. She reports that she drinks about 3.0 - 3.6 oz of alcohol per week . She reports that she does not use drugs. ER MD  Past Medical History:  Diagnosis Date  . Fibroid   . History of migraine with aura   . Ovarian cyst   . Rheumatoid arteritis     Past Surgical History:  Procedure Laterality Date  . COLPOSCOPY    . INTRAUTERINE DEVICE (IUD) INSERTION  2016   Mirena   . WISDOM TOOTH EXTRACTION      Current Outpatient Prescriptions  Medication Sig Dispense Refill  . cetirizine (ZYRTEC) 10 MG tablet Take 10 mg by mouth daily.    Marland Kitchen leflunomide (ARAVA) 10 MG tablet Take 10 mg by mouth daily.    Marland Kitchen  levonorgestrel (MIRENA) 20 MCG/24HR IUD 1 Intra Uterine Device (1 each total) by Intrauterine route once. 1 each 0  . omeprazole (PRILOSEC) 20 MG capsule Take 20 mg by mouth daily.     No current facility-administered medications for this visit.     Family History  Problem Relation Age of Onset  . Cancer Mother        gallbladder  . Colon cancer Father 65  . Breast cancer Sister 62  . Seizures Paternal Uncle   . Breast cancer Maternal Grandmother   . Colon cancer Paternal Grandfather 77    Review of Systems  Constitutional: Negative.   HENT: Negative.   Eyes: Negative.   Respiratory: Negative.   Cardiovascular: Positive for leg swelling.  Gastrointestinal: Negative.   Endocrine: Negative.   Genitourinary: Negative.   Musculoskeletal: Positive for myalgias.       Joint pain   Skin: Negative.   Allergic/Immunologic: Negative.   Neurological: Negative.   Psychiatric/Behavioral: Negative.     Exam:   BP 128/70 (BP Location: Right Arm, Patient Position: Sitting, Cuff Size: Normal)   Pulse 72   Resp 16   Ht 5\' 8"  (1.727 m)   Wt 181 lb (82.1 kg)   BMI 27.52 kg/m  Weight change: @WEIGHTCHANGE @ Height:   Height: 5\' 8"  (172.7 cm)  Ht Readings from Last 3 Encounters:  01/30/17 5\' 8"  (1.727 m)  01/29/17 5\' 8"  (1.727 m)  01/30/16 5' 8.25" (1.734 m)    General appearance: alert, cooperative and appears stated age Head: Normocephalic, without obvious abnormality, atraumatic Neck: no adenopathy, supple, symmetrical, trachea midline and thyroid normal to inspection and palpation Lungs: clear to auscultation bilaterally Cardiovascular: regular rate and rhythm Breasts: normal appearance, no masses or tenderness Abdomen: soft, non-tender; non distended,  no masses,  no organomegaly Extremities: extremities normal, atraumatic, no cyanosis or edema Skin: Skin color, texture, turgor normal. No rashes or lesions Lymph nodes: Cervical, supraclavicular, and axillary nodes normal. No  abnormal inguinal nodes palpated Neurologic: Grossly normal   Pelvic: External genitalia:  no lesions              Urethra:  normal appearing urethra with no masses, tenderness or lesions              Bartholins and Skenes: normal                 Vagina: normal appearing vagina with normal color and discharge, no lesions              Cervix: no lesions and IUD string 2 cm               Bimanual Exam:  Uterus:  normal size, contour, position, consistency, mobility, non-tender and anteverted              Adnexa: no mass, fullness, tenderness               Rectovaginal: Confirms               Anus:  normal sphincter tone, no lesions  Chaperone was present for exam.  A:  Well Woman with normal exam  Intermittent pelvic pain  IUD check  Family history of colon cancer (and gallbladder cancer)  P:   She is going to see GI to discuss screening  No pap this year  Mammogram today  She will call with pelvic pain and we will get her in for an ultrasound  Discussed breast self exam  Discussed calcium and vit D intake  She is following up with a new primarylk

## 2017-01-30 NOTE — Patient Instructions (Signed)

## 2017-02-05 ENCOUNTER — Other Ambulatory Visit
Admission: RE | Admit: 2017-02-05 | Discharge: 2017-02-05 | Disposition: A | Discharge status: Home / Self Care | Payer: PRIVATE HEALTH INSURANCE | Source: Ambulatory Visit | Attending: Internal Medicine | Admitting: Internal Medicine

## 2017-02-05 DIAGNOSIS — E782 Mixed hyperlipidemia: Secondary | ICD-10-CM | POA: Diagnosis present

## 2017-02-05 DIAGNOSIS — R1310 Dysphagia, unspecified: Secondary | ICD-10-CM | POA: Insufficient documentation

## 2017-02-05 LAB — LIPID PANEL
CHOL/HDL RATIO: 2.3 ratio
Cholesterol: 187 mg/dL (ref 0–200)
HDL: 82 mg/dL (ref >40)
LDL CALC: 93 mg/dL (ref 0–99)
Triglycerides: 58 mg/dL (ref <150)
VLDL: 12 mg/dL (ref 0–40)

## 2017-02-05 LAB — TSH: TSH: 1.169 u[IU]/mL (ref 0.350–4.500)

## 2017-03-04 ENCOUNTER — Encounter: Payer: Self-pay | Admitting: Gastroenterology

## 2017-03-04 ENCOUNTER — Ambulatory Visit (INDEPENDENT_AMBULATORY_CARE_PROVIDER_SITE_OTHER): Payer: PRIVATE HEALTH INSURANCE | Admitting: Gastroenterology

## 2017-03-04 VITALS — BP 129/78 | HR 77 | Temp 98.0°F | Ht 68.0 in | Wt 183.6 lb

## 2017-03-04 DIAGNOSIS — Z800 Family history of malignant neoplasm of digestive organs: Secondary | ICD-10-CM

## 2017-03-04 DIAGNOSIS — R1310 Dysphagia, unspecified: Secondary | ICD-10-CM | POA: Diagnosis not present

## 2017-03-04 NOTE — Progress Notes (Signed)
Jonathon Bellows MD, MRCP(U.K) [address]  [place]  [place], [place] [address]  Main: [phone]  Fax: [phone]   Gastroenterology Consultation  Referring Provider:     Glean Hess, MD Primary Care Physician:  Glean Hess, MD Primary Gastroenterologist:  Dr. 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 She also has a family history of colon cancer.    Dysphagia: Onset and any progression: Ongoing on and off 1-2 years, if she does not take PPI gets worse  Frequency: has few episodes together then none for a while  Foods affected : Bread, not for meat , not for liquids either, and hard boiled eggs Prior episodes of impaction: yes-sometimes has to regurgitate it  History of asthma/allergy : Allergies-seasonal , cats and dogs.  History of heartburn/Reflux : yes  Weight loss/weight gain : fluctuate   Prior EGD: no  PPI/H2 blocker use : She takes nexium , everyday , if she forgets to take a few doses has more symptoms. Takes before food. . Does wake up in the night with some chest discomfort.   No family history of esophageal cancer not ben a smoker.    Father and grand father had colon cancer. Father had it in his 7's. Sister had breast cancer. Gall bladder cancer in mother- she was a smoker. Last colonoscopy was 15 years back- internal hemoroids.     Past Medical History:  Diagnosis Date  . Fibroid   . History of migraine with aura   . Ovarian cyst   . Rheumatoid arteritis     Past Surgical History:  Procedure Laterality Date  . COLPOSCOPY    . INTRAUTERINE DEVICE (IUD) INSERTION  2016   Mirena   . WISDOM TOOTH EXTRACTION      Prior to Admission medications   Medication Sig Start Date End Date Taking? Authorizing Provider  cetirizine (ZYRTEC) 10 MG tablet Take 10 mg by mouth daily.   Yes [provider]  esomeprazole (NEXIUM) 20 MG  capsule Take 20 mg by mouth daily at 12 noon.   Yes [provider]  leflunomide (ARAVA) 20 MG tablet TAKE 1 2 TABLET BY MOUTH DAILY 02/18/17  Yes [provider]  levonorgestrel (MIRENA) 20 MCG/24HR IUD 1 Intra Uterine Device (1 each total) by Intrauterine route once. 01/12/15  Yes Salvadore Dom, MD  omeprazole (PRILOSEC) 20 MG capsule Take 20 mg by mouth daily.   Yes [provider]    Family History  Problem Relation Age of Onset  . Cancer Mother        gallbladder  . Colon cancer Father 22  . Breast cancer Sister 55  . Seizures Paternal Uncle   . Breast cancer Maternal Grandmother   . Colon cancer Paternal Grandfather 46     Social History  Substance Use Topics  . Smoking status: Never Smoker  . Smokeless tobacco: Never Used  . Alcohol use 3.0 - 3.6 oz/week    3 Cans of beer, 2 - 3 Standard drinks or equivalent per week    Allergies as of 03/04/2017 - Review Complete 03/04/2017  Allergen Reaction Noted  . Amoxicillin Rash 01/05/2015  . Enbrel [etanercept] Hives 01/05/2015    Review of Systems:    All systems reviewed and negative except where noted in HPI.   Physical Exam:  BP 129/78 (BP Location: Right Arm, Patient Position: Sitting, Cuff Size: Normal)   Pulse 77   Temp 98 F (36.7 C) (Oral)   Ht 5\' 8"  (1.727 m)   Wt 183 lb 9.6 oz (83.3 kg)   BMI 27.92 kg/m  No LMP recorded. Patient is not currently having periods (Reason: IUD). Psych:  Alert and cooperative. Normal mood and affect. General:   Alert,  Well-developed, well-nourished, pleasant and cooperative in NAD Head:  Normocephalic and atraumatic. Eyes:  Sclera clear, no icterus.   Conjunctiva pink. Ears:  Normal auditory acuity. Nose:  No deformity, discharge, or lesions. Mouth:  No deformity or lesions,oropharynx pink & moist. Neck:  Supple; no masses or thyromegaly. Lungs:  Respirations even and unlabored.  Clear throughout to auscultation.   No wheezes, crackles, or  rhonchi. No acute distress. Heart:  Regular rate and rhythm; no murmurs, clicks, rubs, or gallops. Abdomen:  Normal bowel sounds.  No bruits.  Soft, non-tender and non-distended without masses, hepatosplenomegaly or hernias noted.  No guarding or rebound tenderness.    Neurologic:  Alert and oriented x3;  grossly normal neurologically. Skin:  Intact without significant lesions or rashes. No jaundice. Lymph Nodes:  No significant cervical adenopathy. Psych:  Alert and cooperative. Normal mood and affect.  Imaging Studies: No results found.  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 History is suggestive of likely esophageal dysmotility from GERD.  Family history of colon cancer and due for a colonoscopy    Plan  1. GERD: Counseled on life style changes, suggest to use PPI first thing in the morning on empty stomach and eat 30 minutes after. Advised on the use of a wedge pillow at night , avoid meals for 2 hours prior to bed time.Discussed the risks and benefits of long term PPI use including but not limited to bone loss, chronic kidney disease, infections , low magnesium . Aim to use at the lowest dose for the shortest period of time.Provided patient information on GERD.   2. Dysphagia- EGD+/- dilation, biopsies to r/o EOE. Increase nexium to 40 mg BID. Will screen for celiac disease as she has issues swallowing bread . If all tests are negative will consider manometry.   3. Colonoscopy - High risk   I have discussed alternative options, risks & benefits,  which include, but are not limited to, bleeding, infection, perforation,respiratory complication & drug reaction.  The patient agrees with this plan & written consent will be obtained.      Follow up in 8 weeks   Dr Jonathon Bellows MD,MRCP(U.K)

## 2017-03-04 NOTE — Addendum Note (Signed)
Addended by: Peggye Ley on: 03/04/2017 02:13 PM   Modules accepted: Orders, SmartSet

## 2017-03-08 ENCOUNTER — Encounter: Payer: Self-pay | Admitting: *Deleted

## 2017-03-08 ENCOUNTER — Ambulatory Visit
Admission: RE | Admit: 2017-03-08 | Discharge: 2017-03-08 | Disposition: A | Discharge status: Home / Self Care | Payer: PRIVATE HEALTH INSURANCE | Source: Ambulatory Visit | Attending: Gastroenterology | Admitting: Gastroenterology

## 2017-03-08 ENCOUNTER — Encounter
Admission: RE | Disposition: A | Discharge status: Home / Self Care | Payer: Self-pay | Source: Ambulatory Visit | Attending: Gastroenterology

## 2017-03-08 ENCOUNTER — Ambulatory Visit: Payer: PRIVATE HEALTH INSURANCE | Admitting: Anesthesiology

## 2017-03-08 DIAGNOSIS — K2970 Gastritis, unspecified, without bleeding: Secondary | ICD-10-CM | POA: Diagnosis not present

## 2017-03-08 DIAGNOSIS — R1310 Dysphagia, unspecified: Secondary | ICD-10-CM | POA: Diagnosis not present

## 2017-03-08 DIAGNOSIS — K3189 Other diseases of stomach and duodenum: Secondary | ICD-10-CM | POA: Diagnosis not present

## 2017-03-08 DIAGNOSIS — Z800 Family history of malignant neoplasm of digestive organs: Secondary | ICD-10-CM | POA: Insufficient documentation

## 2017-03-08 DIAGNOSIS — K209 Esophagitis, unspecified: Secondary | ICD-10-CM | POA: Insufficient documentation

## 2017-03-08 DIAGNOSIS — Z1211 Encounter for screening for malignant neoplasm of colon: Secondary | ICD-10-CM | POA: Insufficient documentation

## 2017-03-08 HISTORY — PX: ESOPHAGOGASTRODUODENOSCOPY (EGD) WITH PROPOFOL: SHX5813

## 2017-03-08 HISTORY — PX: COLONOSCOPY WITH PROPOFOL: SHX5780

## 2017-03-08 LAB — POCT PREGNANCY, URINE: PREG TEST UR: NEGATIVE

## 2017-03-08 SURGERY — COLONOSCOPY WITH PROPOFOL
Anesthesia: General

## 2017-03-08 MED ORDER — PROPOFOL 500 MG/50ML IV EMUL
INTRAVENOUS | Status: AC
  Filled 2017-03-08: qty 50

## 2017-03-08 MED ORDER — PROPOFOL 500 MG/50ML IV EMUL
INTRAVENOUS | Status: DC | PRN
  Administered 2017-03-08: 160 ug/kg/min via INTRAVENOUS

## 2017-03-08 MED ORDER — SODIUM CHLORIDE 0.9 % IV SOLN
INTRAVENOUS | Status: DC
  Administered 2017-03-08: 08:00:00 via INTRAVENOUS

## 2017-03-08 MED ORDER — LIDOCAINE HCL (CARDIAC) 20 MG/ML IV SOLN
INTRAVENOUS | Status: DC | PRN
  Administered 2017-03-08: 100 mg via INTRAVENOUS

## 2017-03-08 MED ORDER — GLYCOPYRROLATE 0.2 MG/ML IJ SOLN
INTRAMUSCULAR | Status: AC
  Filled 2017-03-08: qty 1

## 2017-03-08 MED ORDER — PROPOFOL 10 MG/ML IV BOLUS
INTRAVENOUS | Status: DC | PRN
  Administered 2017-03-08 (×2): 40 mg via INTRAVENOUS
  Administered 2017-03-08: 30 mg via INTRAVENOUS
  Administered 2017-03-08: 50 mg via INTRAVENOUS
  Administered 2017-03-08: 100 mg via INTRAVENOUS
  Administered 2017-03-08: 40 mg via INTRAVENOUS

## 2017-03-08 MED ORDER — LIDOCAINE HCL (PF) 2 % IJ SOLN
INTRAMUSCULAR | Status: AC
  Filled 2017-03-08: qty 10

## 2017-03-08 MED ORDER — GLYCOPYRROLATE 0.2 MG/ML IV SOSY
PREFILLED_SYRINGE | INTRAVENOUS | Status: DC | PRN
  Administered 2017-03-08: .2 mg via INTRAVENOUS

## 2017-03-08 NOTE — Anesthesia Post-op Follow-up Note (Signed)
Anesthesia QCDR form completed.        

## 2017-03-08 NOTE — H&P (Signed)
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 Fibroid   . History of migraine with aura   . Ovarian cyst   . Rheumatoid arteritis     Past Surgical History:  Procedure Laterality Date  . COLPOSCOPY    . INTRAUTERINE DEVICE (IUD) INSERTION  2016   Mirena   . WISDOM TOOTH EXTRACTION      Prior to Admission medications   Medication Sig Start Date End Date Taking? Authorizing Provider  esomeprazole (NEXIUM) 20 MG capsule Take 20 mg by mouth daily at 12 noon.   Yes [provider]  leflunomide (ARAVA) 20 MG tablet TAKE 1 2 TABLET BY MOUTH DAILY 02/18/17  Yes [provider]  cetirizine (ZYRTEC) 10 MG tablet Take 10 mg by mouth daily.    [provider]  levonorgestrel (MIRENA) 20 MCG/24HR IUD 1 Intra Uterine Device (1 each total) by Intrauterine route once. 01/12/15   Salvadore Dom, MD  omeprazole (PRILOSEC) 20 MG capsule Take 20 mg by mouth daily.    [provider]    Allergies as of 03/04/2017 - Review Complete 03/04/2017  Allergen Reaction Noted  . Amoxicillin Rash 01/05/2015  . Enbrel [etanercept] Hives 01/05/2015    Family History  Problem Relation Age of Onset  . Cancer Mother        gallbladder  . Colon cancer Father 22  . Breast cancer Sister 59  . Seizures Paternal Uncle   . Breast cancer Maternal Grandmother   . Colon cancer Paternal Grandfather 64    Social History   Social History  . Marital status: Single    Spouse name: N/A  . Number of children: N/A  . Years of education: N/A   Occupational History  . physician    Social History Main Topics  . Smoking status: Never  Smoker  . Smokeless tobacco: Never Used  . Alcohol use 3.0 - 3.6 oz/week    3 Cans of beer, 2 - 3 Standard drinks or equivalent per week  . Drug use: No  . Sexual activity: Yes    Partners: Male    Birth control/ protection: IUD   Other Topics Concern  . Not on file   Social History Narrative   The patient is an ER MD for [employer]    Review of Systems: See HPI, otherwise negative ROS  Physical Exam: BP 118/78   Pulse 88   Temp 98.1 F (36.7 C) (Tympanic)   Resp 16   Ht 5\' 8"  (1.727 m)   Wt 183 lb (83 kg)   SpO2 100%   BMI 27.83 kg/m  General:   Alert,  pleasant and cooperative in NAD Head:  Normocephalic and atraumatic. Neck:  Supple; no masses or thyromegaly. Lungs:  Clear throughout to auscultation, normal respiratory effort.    Heart:  +S1, +S2, Regular rate and rhythm, No edema. Abdomen:  Soft, nontender and nondistended. Normal bowel sounds, without guarding, and without rebound.   Neurologic:  Alert and  oriented x4;  grossly normal neurologically.  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  Questions have been answered.  All parties agreeable.   Elizabeth Bellows, MD  03/08/2017, 8:10 AM

## 2017-03-08 NOTE — Op Note (Signed)
[facility] Gastroenterology Patient Name: Elizabeth Swanson Procedure Date: 03/08/2017 8:30 AM MRN: [id number] Account #: [id number] Date of Birth: 03-20-1976 Admit Type: Ambulatory Age: 41 Room: [facility] ENDO ROOM 4 Gender: Female Note Status: Finalized Procedure:            Colonoscopy Indications:          Screening in patient at increased risk: Family history                        of 1st-degree relative with colorectal cancer Providers:            Jonathon Bellows MD, MD Medicines:            Monitored Anesthesia Care Complications:        No immediate complications. Procedure:            Pre-Anesthesia Assessment:                       - Prior to the procedure, a History and Physical was                        performed, and patient medications, allergies and                        sensitivities were reviewed. The patient's tolerance of                        previous anesthesia was reviewed.                       - The risks and benefits of the procedure and the                        sedation options and risks were discussed with the                        patient. All questions were answered and informed                        consent was obtained.                       - ASA Grade Assessment: III - A patient with severe                        systemic disease.                       After obtaining informed consent, the colonoscope was                        passed under direct vision. Throughout the procedure,                        the patient's blood pressure, pulse, and oxygen                        saturations were monitored continuously. The                        Colonoscope was introduced through the anus and  advanced to the the cecum, identified by the                        appendiceal orifice, IC valve and transillumination.                        The colonoscopy was performed with ease. The patient                        tolerated  the procedure well. The quality of the bowel                        preparation was excellent. Findings:      The perianal and digital rectal examinations were normal.      The entire examined colon appeared normal on direct and retroflexion       views. Impression:           - The entire examined colon is normal on direct and                        retroflexion views.                       - No specimens collected. Recommendation:       - Discharge patient to home (with escort).                       - Resume previous diet.                       - Continue present medications.                       - Repeat colonoscopy in 5 years for surveillance. Procedure Code(s):    --- Professional ---                       [phone], Colonoscopy, flexible; diagnostic, including                        collection of specimen(s) by brushing or washing, when                        performed (separate procedure) Diagnosis Code(s):    --- Professional ---                       Z80.0, Family history of malignant neoplasm of                        digestive organs CPT copyright 2016 American Medical Association. All rights reserved. The codes documented in this report are preliminary and upon coder review may  be revised to meet current compliance requirements. Jonathon Bellows, MD Jonathon Bellows MD, MD 03/08/2017 8:47:26 AM This report has been signed electronically. Number of Addenda: 0 Note Initiated On: 03/08/2017 8:30 AM Scope Withdrawal Time: 0 hours 10 minutes 2 seconds  Total Procedure Duration: 0 hours 12 minutes 49 seconds       [facility]

## 2017-03-08 NOTE — Transfer of Care (Signed)
Immediate Anesthesia Transfer of Care Note  Patient: Elizabeth Swanson, Dr.  Jule Ser) Performed: COLONOSCOPY WITH PROPOFOL (N/A ) ESOPHAGOGASTRODUODENOSCOPY (EGD) WITH PROPOFOL (N/A )  Patient Location: Endoscopy Unit  Anesthesia Type:General  Level of Consciousness: awake  Airway & Oxygen Therapy: Patient Spontanous Breathing and Patient connected to nasal cannula oxygen  Post-op Assessment: Report given to RN and Post -op Vital signs reviewed and stable  Post vital signs: Reviewed and stable  Last Vitals:  Vitals:   03/08/17 0744 03/08/17 0849  BP: 118/78 110/78  Pulse: 88 81  Resp: 16 20  Temp: 36.7 C   SpO2: 100% 100%    Last Pain:  Vitals:   03/08/17 0744  TempSrc: Tympanic         Complications: No apparent anesthesia complications

## 2017-03-08 NOTE — Anesthesia Postprocedure Evaluation (Signed)
Anesthesia Post Note  Patient: Elizabeth Swanson, Dr.  Jule Ser) Performed: COLONOSCOPY WITH PROPOFOL (N/A ) ESOPHAGOGASTRODUODENOSCOPY (EGD) WITH PROPOFOL (N/A )  Patient location during evaluation: Endoscopy Anesthesia Type: General Level of consciousness: awake and alert Pain management: pain level controlled Vital Signs Assessment: post-procedure vital signs reviewed and stable Respiratory status: spontaneous breathing, nonlabored ventilation, respiratory function stable and patient connected to nasal cannula oxygen Cardiovascular status: blood pressure returned to baseline and stable Postop Assessment: no apparent nausea or vomiting Anesthetic complications: no     Last Vitals:  Vitals:   03/08/17 0858 03/08/17 0908  BP: 105/71 (!) 113/91  Pulse: 81 78  Resp: 18 18  Temp:    SpO2: 100% 100%    Last Pain:  Vitals:   03/08/17 0848  TempSrc: Tympanic                 Precious Haws Piscitello

## 2017-03-08 NOTE — Op Note (Signed)
[facility] Gastroenterology Patient Name: Elizabeth Swanson Procedure Date: 03/08/2017 8:16 AM MRN: [id number] Account #: [id number] Date of Birth: 04/23/1976 Admit Type: Ambulatory Age: 41 Room: [facility] ENDO ROOM 4 Gender: Female Note Status: Finalized Procedure:            Upper GI endoscopy Indications:          Dysphagia Providers:            Jonathon Bellows MD, MD Medicines:            Monitored Anesthesia Care Complications:        No immediate complications. Procedure:            Pre-Anesthesia Assessment:                       - Prior to the procedure, a History and Physical was                        performed, and patient medications, allergies and                        sensitivities were reviewed. The patient's tolerance of                        previous anesthesia was reviewed.                       - The risks and benefits of the procedure and the                        sedation options and risks were discussed with the                        patient. All questions were answered and informed                        consent was obtained.                       - ASA Grade Assessment: III - A patient with severe                        systemic disease.                       After obtaining informed consent, the endoscope was                        passed under direct vision. Throughout the procedure,                        the patient's blood pressure, pulse, and oxygen                        saturations were monitored continuously. The Endoscope                        was introduced through the mouth, and advanced to the                        third part of duodenum. The upper GI endoscopy was  accomplished with ease. The patient tolerated the                        procedure well. Findings:      The examined duodenum was normal.      Localized moderately erythematous mucosa without bleeding was found in       the gastric antrum and in  the prepyloric region of the stomach. Biopsies       were taken with a cold forceps for histology. Biopsies were taken with a       cold forceps for histology.      Mucosal changes including longitudinal furrows were found in the entire       esophagus. Esophageal findings were graded using the Eosinophilic       Esophagitis Endoscopic Reference Score (EoE-EREFS) as: Edema Grade 1       Present (decreased clarity or absence of vascular markings), Rings Grade       0 None (no ridges or rings seen), Exudates Grade 0 None (no white       lesions seen), Furrows Grade 1 Present (vertical lines with or without       visible depth) and Stricture none (no stricture found). Biopsies were       obtained from the proximal and distal esophagus with cold forceps for       histology of suspected eosinophilic esophagitis. Impression:           - Normal examined duodenum.                       - Erythematous mucosa in the antrum and prepyloric                        region of the stomach. Biopsied.                       - Esophageal mucosal changes suggestive of eosinophilic                        esophagitis. Biopsied. Recommendation:       - Await pathology results.                       - Perform a colonoscopy today. Procedure Code(s):    --- Professional ---                       [phone], Esophagogastroduodenoscopy, flexible, transoral;                        with biopsy, single or multiple Diagnosis Code(s):    --- Professional ---                       K31.89, Other diseases of stomach and duodenum                       K20.9, Esophagitis, unspecified                       R13.10, Dysphagia, unspecified CPT copyright 2016 American Medical Association. All rights reserved. The codes documented in this report are preliminary and upon coder review may  be revised to meet current compliance requirements. Jonathon Bellows, MD Jonathon Bellows MD, MD 03/08/2017 8:30:38 AM This  report has been signed  electronically. Number of Addenda: 0 Note Initiated On: 03/08/2017 8:16 AM      [facility]

## 2017-03-08 NOTE — Anesthesia Preprocedure Evaluation (Signed)
Anesthesia Evaluation  Patient identified by MRN, date of birth, ID band Patient awake    Reviewed: Allergy & Precautions, H&P , NPO status , Patient's Chart, lab work & pertinent test results  History of Anesthesia Complications Negative for: history of anesthetic complications  Airway Mallampati: III  TM Distance: <3 FB Neck ROM: full    Dental  (+) Chipped   Pulmonary neg pulmonary ROS, neg shortness of breath,           Cardiovascular Exercise Tolerance: Good (-) angina(-) Past MI and (-) DOE negative cardio ROS       Neuro/Psych  Headaches, negative psych ROS   GI/Hepatic negative GI ROS, Neg liver ROS, neg GERD  ,  Endo/Other  negative endocrine ROS  Renal/GU negative Renal ROS  negative genitourinary   Musculoskeletal   Abdominal   Peds  Hematology negative hematology ROS (+)   Anesthesia Other Findings Past Medical History: No date: Fibroid No date: History of migraine with aura No date: Ovarian cyst No date: Rheumatoid arteritis  Past Surgical History: No date: COLPOSCOPY 2016: INTRAUTERINE DEVICE (IUD) INSERTION     Comment:  Mirena  No date: WISDOM TOOTH EXTRACTION  BMI    Body Mass Index:  27.83 kg/m      Reproductive/Obstetrics negative OB ROS                             Anesthesia Physical Anesthesia Plan  ASA: III  Anesthesia Plan: General   Post-op Pain Management:    Induction: Intravenous  PONV Risk Score and Plan: Propofol infusion  Airway Management Planned: Natural Airway and Nasal Cannula  Additional Equipment:   Intra-op Plan:   Post-operative Plan:   Informed Consent: I have reviewed the patients History and Physical, chart, labs and discussed the procedure including the risks, benefits and alternatives for the proposed anesthesia with the patient or authorized representative who has indicated his/her understanding and acceptance.    Dental Advisory Given  Plan Discussed with: Anesthesiologist, CRNA and Surgeon  Anesthesia Plan Comments: (Patient consented for risks of anesthesia including but not limited to:  - adverse reactions to medications - risk of intubation if required - damage to teeth, lips or other oral mucosa - sore throat or hoarseness - Damage to heart, brain, lungs or loss of life  Patient voiced understanding.)        Anesthesia Quick Evaluation

## 2017-03-09 NOTE — Progress Notes (Signed)
Non-identifying voicemail.  No message left.

## 2017-03-11 ENCOUNTER — Encounter: Payer: Self-pay | Admitting: Gastroenterology

## 2017-03-11 LAB — SURGICAL PATHOLOGY

## 2017-05-14 DIAGNOSIS — M150 Primary generalized (osteo)arthritis: Secondary | ICD-10-CM | POA: Insufficient documentation

## 2017-05-14 DIAGNOSIS — M159 Polyosteoarthritis, unspecified: Secondary | ICD-10-CM | POA: Insufficient documentation

## 2018-01-30 NOTE — Progress Notes (Signed)
42 y.o. G0P0000 Single White or Caucasian Not Hispanic or Latino female here for annual exam. She has a mirena IUD, placed in 9/16. Cramping has improved, now just with a cycle or when she is supposed to have a cycle. Light bleeding for 1-2 days ~every 3 months. Tolerable. Sexually active, same partner, no dyspareunia.  Her arthritis is under better control, can be active again.  Period Duration (Days): 2 days Period Pattern: (!) Irregular Menstrual Flow: Light Menstrual Control: Thin pad Menstrual Control Change Freq (Hours): changes pad every 8 hours Dysmenorrhea: (!) Mild Dysmenorrhea Symptoms: Cramping  No LMP recorded. (Menstrual status: IUD).          Sexually active: Yes.    The current method of family planning is IUD.    Exercising: Yes.    run, bike Smoker:  no  Health Maintenance: Pap:  08/16 normal with negative HPV History of abnormal Pap:  Yes many years ago, colpo per patient was negative MMG:  01/30/2017 BI-RADS CATEGORY  1: Negative. BMD:   n/a Colonoscopy: 03/2017 repeat in 5 years TDaP:  Unsure, she will check Gardasil: No, discussed.    reports that she has never smoked. She has never used smokeless tobacco. She reports that she drinks about 5.0 - 6.0 standard drinks of alcohol per week. She reports that she does not use drugs. ER MD  Past Medical History:  Diagnosis Date  . Fibroid   . History of migraine with aura   . Ovarian cyst   . Rheumatoid arteritis     Past Surgical History:  Procedure Laterality Date  . COLONOSCOPY WITH PROPOFOL N/A 03/08/2017   Procedure: COLONOSCOPY WITH PROPOFOL;  Surgeon: Jonathon Bellows, MD;  Location: [facility] ENDOSCOPY;  Service: Gastroenterology;  Laterality: N/A;  . COLPOSCOPY    . ESOPHAGOGASTRODUODENOSCOPY (EGD) WITH PROPOFOL N/A 03/08/2017   Procedure: ESOPHAGOGASTRODUODENOSCOPY (EGD) WITH PROPOFOL;  Surgeon: Jonathon Bellows, MD;  Location: [facility] ENDOSCOPY;  Service: Gastroenterology;  Laterality: N/A;  . INTRAUTERINE DEVICE (IUD)  INSERTION  2016   Mirena   . WISDOM TOOTH EXTRACTION      Current Outpatient Medications  Medication Sig Dispense Refill  . cetirizine (ZYRTEC) 10 MG tablet Take 10 mg by mouth daily.    Marland Kitchen leflunomide (ARAVA) 20 MG tablet TAKE 1 2 TABLET BY MOUTH DAILY  2  . levonorgestrel (MIRENA) 20 MCG/24HR IUD 1 Intra Uterine Device (1 each total) by Intrauterine route once. 1 each 0  . omeprazole (PRILOSEC) 20 MG capsule Take 20 mg by mouth daily.     No current facility-administered medications for this visit.     Family History  Problem Relation Age of Onset  . Cancer Mother        gallbladder  . Colon cancer Father 59  . Breast cancer Sister 64  . Seizures Paternal Uncle   . Breast cancer Maternal Grandmother   . Colon cancer Paternal Grandfather 16    Review of Systems  Constitutional: Negative.   HENT: Negative.   Eyes: Negative.   Respiratory: Negative.   Cardiovascular: Negative.   Gastrointestinal: Negative.   Endocrine: Negative.   Genitourinary: Negative.   Musculoskeletal: Negative.   Skin: Negative.   Allergic/Immunologic: Negative.   Neurological: Negative.   Hematological: Negative.   Psychiatric/Behavioral: Negative.   All other systems reviewed and are negative.   Exam:   BP 110/82 (BP Location: Right Arm, Patient Position: Sitting)   Pulse 68   Ht 5\' 8"  (1.727 m)   Wt 173 lb (78.5  kg)   BMI 26.30 kg/m   Weight change: @WEIGHTCHANGE @ Height:   Height: 5\' 8"  (172.7 cm)  Ht Readings from Last 3 Encounters:  02/03/18 5\' 8"  (1.727 m)  03/08/17 5\' 8"  (1.727 m)  03/04/17 5\' 8"  (1.727 m)    General appearance: alert, cooperative and appears stated age Head: Normocephalic, without obvious abnormality, atraumatic Neck: no adenopathy, supple, symmetrical, trachea midline and thyroid normal to inspection and palpation Lungs: clear to auscultation bilaterally Cardiovascular: regular rate and rhythm Breasts: normal appearance, no masses or tenderness Abdomen:  soft, non-tender; non distended,  no masses,  no organomegaly Extremities: extremities normal, atraumatic, no cyanosis or edema Skin: Skin color, texture, turgor normal. No rashes or lesions Lymph nodes: Cervical, supraclavicular, and axillary nodes normal. No abnormal inguinal nodes palpated Neurologic: Grossly normal   Pelvic: External genitalia:  no lesions              Urethra:  normal appearing urethra with no masses, tenderness or lesions              Bartholins and Skenes: normal                 Vagina: normal appearing vagina with normal color and discharge, no lesions              Cervix: no lesions and IUD string 3-4 cm               Bimanual Exam:  Uterus:  normal size, contour, position, consistency, mobility, non-tender              Adnexa: no mass, fullness, tenderness               Rectovaginal: Confirms               Anus:  normal sphincter tone, no lesions  Chaperone was present for exam.  A:  Well Woman with normal exam  IUD check  P:   Pap with hpv  Labs are UTD  She will check about her TDAP  Mammogram due  Discussed breast self exam  Discussed calcium and vit D intake  Gardasil information given

## 2018-02-03 ENCOUNTER — Other Ambulatory Visit: Payer: Self-pay

## 2018-02-03 ENCOUNTER — Encounter: Payer: Self-pay | Admitting: Obstetrics and Gynecology

## 2018-02-03 ENCOUNTER — Ambulatory Visit (INDEPENDENT_AMBULATORY_CARE_PROVIDER_SITE_OTHER): Payer: PRIVATE HEALTH INSURANCE | Admitting: Obstetrics and Gynecology

## 2018-02-03 ENCOUNTER — Other Ambulatory Visit (HOSPITAL_COMMUNITY)
Admission: RE | Admit: 2018-02-03 | Discharge: 2018-02-03 | Disposition: A | Discharge status: Home / Self Care | Payer: PRIVATE HEALTH INSURANCE | Source: Ambulatory Visit | Attending: Obstetrics and Gynecology | Admitting: Obstetrics and Gynecology

## 2018-02-03 VITALS — BP 110/82 | HR 68 | Ht 68.0 in | Wt 173.0 lb

## 2018-02-03 DIAGNOSIS — Z30431 Encounter for routine checking of intrauterine contraceptive device: Secondary | ICD-10-CM

## 2018-02-03 DIAGNOSIS — Z124 Encounter for screening for malignant neoplasm of cervix: Secondary | ICD-10-CM

## 2018-02-03 DIAGNOSIS — Z01419 Encounter for gynecological examination (general) (routine) without abnormal findings: Secondary | ICD-10-CM

## 2018-02-03 NOTE — Patient Instructions (Signed)

## 2018-02-05 LAB — CYTOLOGY - PAP
Diagnosis: NEGATIVE
HPV (WINDOPATH): NOT DETECTED

## 2019-03-09 ENCOUNTER — Ambulatory Visit: Payer: PRIVATE HEALTH INSURANCE | Admitting: Obstetrics and Gynecology

## 2019-03-09 ENCOUNTER — Other Ambulatory Visit: Payer: Self-pay

## 2019-03-09 ENCOUNTER — Encounter: Payer: Self-pay | Admitting: Obstetrics and Gynecology

## 2019-03-09 VITALS — BP 126/68 | HR 91 | Temp 98.2°F | Ht 68.0 in | Wt 182.0 lb

## 2019-03-09 DIAGNOSIS — E559 Vitamin D deficiency, unspecified: Secondary | ICD-10-CM | POA: Diagnosis not present

## 2019-03-09 DIAGNOSIS — Z01419 Encounter for gynecological examination (general) (routine) without abnormal findings: Secondary | ICD-10-CM | POA: Diagnosis not present

## 2019-03-09 DIAGNOSIS — Z800 Family history of malignant neoplasm of digestive organs: Secondary | ICD-10-CM

## 2019-03-09 DIAGNOSIS — Z0000 Encounter for general adult medical examination without abnormal findings: Secondary | ICD-10-CM

## 2019-03-09 NOTE — Progress Notes (Signed)
43 y.o. G0P0000 Single White or Caucasian Not Hispanic or Latino female here for annual exam. She has a mirena IUD, placed in 9/16.  Spotting ~q 3 months. Occasional cramping. No dyspareunia.  Same long term partner, live together.   She had a flare of her Rheumatoid arthritis over the summer, missed her infusion.     No LMP recorded. (Menstrual status: IUD).          Sexually active: Yes.    The current method of family planning is IUD and abstinence.    Exercising: No.  The patient does not participate in regular exercise at present. Smoker:  no  Health Maintenance: Pap:  02/03/18 neg  HR HPV Neg 01-05-15 WNL NEG HR HPV      History of abnormal Pap:  yes MMG:  01/30/17 Density B Bi-rads 1 neg  BMD:   NA Colonoscopy: 11/18, f/u in 5 years.  TDaP:  Up to date  Gardasil: na    reports that she has never smoked. She has never used smokeless tobacco. She reports current alcohol use of about 5.0 - 6.0 standard drinks of alcohol per week. She reports that she does not use drugs. ER MD  Past Medical History:  Diagnosis Date  . Fibroid   . History of migraine with aura   . Ovarian cyst   . Rheumatoid arteritis ([place])     Past Surgical History:  Procedure Laterality Date  . COLONOSCOPY WITH PROPOFOL N/A 03/08/2017   Procedure: COLONOSCOPY WITH PROPOFOL;  Surgeon: Jonathon Bellows, MD;  Location: [facility] ENDOSCOPY;  Service: Gastroenterology;  Laterality: N/A;  . COLPOSCOPY    . ESOPHAGOGASTRODUODENOSCOPY (EGD) WITH PROPOFOL N/A 03/08/2017   Procedure: ESOPHAGOGASTRODUODENOSCOPY (EGD) WITH PROPOFOL;  Surgeon: Jonathon Bellows, MD;  Location: [facility] ENDOSCOPY;  Service: Gastroenterology;  Laterality: N/A;  . INTRAUTERINE DEVICE (IUD) INSERTION  2016   Mirena   . WISDOM TOOTH EXTRACTION      Current Outpatient Medications  Medication Sig Dispense Refill  . cetirizine (ZYRTEC) 10 MG tablet Take 10 mg by mouth daily.    Marland Kitchen leflunomide (ARAVA) 20 MG tablet TAKE 1 2 TABLET BY MOUTH DAILY  2  .  levonorgestrel (MIRENA) 20 MCG/24HR IUD 1 Intra Uterine Device (1 each total) by Intrauterine route once. 1 each 0  . omeprazole (PRILOSEC) 20 MG capsule Take 20 mg by mouth daily.     No current facility-administered medications for this visit.     Family History  Problem Relation Age of Onset  . Cancer Mother        gallbladder  . Colon cancer Father 11  . Breast cancer Sister 25  . Seizures Paternal Uncle   . Breast cancer Maternal Grandmother   . Colon cancer Paternal Grandfather 36    Review of Systems  All other systems reviewed and are negative.   Exam:   BP 126/68   Pulse 91   Temp 98.2 F (36.8 C)   Ht 5\' 8"  (1.727 m)   Wt 182 lb (82.6 kg)   SpO2 98%   BMI 27.67 kg/m   Weight change: @WEIGHTCHANGE @ Height:   Height: 5\' 8"  (172.7 cm)  Ht Readings from Last 3 Encounters:  03/09/19 5\' 8"  (1.727 m)  02/03/18 5\' 8"  (1.727 m)  03/08/17 5\' 8"  (1.727 m)    General appearance: alert, cooperative and appears stated age Head: Normocephalic, without obvious abnormality, atraumatic Neck: no adenopathy, supple, symmetrical, trachea midline and thyroid normal to inspection and palpation Lungs: clear to auscultation  bilaterally Cardiovascular: regular rate and rhythm Breasts: normal appearance, no masses or tenderness Abdomen: soft, non-tender; non distended,  no masses,  no organomegaly Extremities: extremities normal, atraumatic, no cyanosis or edema Skin: Skin color, texture, turgor normal. No rashes or lesions Lymph nodes: Cervical, supraclavicular, and axillary nodes normal. No abnormal inguinal nodes palpated Neurologic: Grossly normal   Pelvic: External genitalia:  no lesions              Urethra:  normal appearing urethra with no masses, tenderness or lesions              Bartholins and Skenes: normal                 Vagina: normal appearing vagina with normal color and discharge, no lesions              Cervix: no lesions and IUD string 3 cm                Bimanual Exam:  Uterus:  normal size, contour, position, consistency, mobility, non-tender              Adnexa: no mass, fullness, tenderness               Rectovaginal: Confirms               Anus:  normal sphincter tone, no lesions  Chaperone was present for exam.  A:  Well Woman with normal exam  FH of colon cancer  IUD check, good for another year  P:   No pap this year  Screening labs  Discussed breast self exam  Discussed calcium and vit D intake  Mammogram overdue, she will schedule  Colonoscopy is UTD

## 2019-03-09 NOTE — Patient Instructions (Signed)

## 2019-03-11 LAB — COMPREHENSIVE METABOLIC PANEL
ALT: 16 IU/L (ref 0–32)
AST: 15 IU/L (ref 0–40)
Albumin/Globulin Ratio: 2.5 — ABNORMAL HIGH (ref 1.2–2.2)
Albumin: 4.7 g/dL (ref 3.8–4.8)
Alkaline Phosphatase: 68 IU/L (ref 39–117)
BUN/Creatinine Ratio: 15 (ref 9–23)
BUN: 12 mg/dL (ref 6–24)
Bilirubin Total: 0.4 mg/dL (ref 0.0–1.2)
CO2: 22 mmol/L (ref 20–29)
Calcium: 9 mg/dL (ref 8.7–10.2)
Chloride: 105 mmol/L (ref 96–106)
Creatinine, Ser: 0.79 mg/dL (ref 0.57–1.00)
GFR calc Af Amer: 107 mL/min/{1.73_m2} (ref >59)
GFR calc non Af Amer: 93 mL/min/{1.73_m2} (ref >59)
Globulin, Total: 1.9 g/dL (ref 1.5–4.5)
Glucose: 85 mg/dL (ref 65–99)
Potassium: 4.2 mmol/L (ref 3.5–5.2)
Sodium: 139 mmol/L (ref 134–144)
Total Protein: 6.6 g/dL (ref 6.0–8.5)

## 2019-03-11 LAB — CBC
Hematocrit: 42.3 % (ref 34.0–46.6)
Hemoglobin: 13.9 g/dL (ref 11.1–15.9)
MCH: 30.1 pg (ref 26.6–33.0)
MCHC: 32.9 g/dL (ref 31.5–35.7)
MCV: 92 fL (ref 79–97)
Platelets: 268 10*3/uL (ref 150–450)
RBC: 4.62 x10E6/uL (ref 3.77–5.28)
RDW: 12 % (ref 11.7–15.4)
WBC: 5.2 10*3/uL (ref 3.4–10.8)

## 2019-03-11 LAB — VITAMIN D 25 HYDROXY (VIT D DEFICIENCY, FRACTURES): Vit D, 25-Hydroxy: 47 ng/mL (ref 30.0–100.0)

## 2019-03-23 ENCOUNTER — Encounter: Payer: Self-pay | Admitting: Obstetrics and Gynecology

## 2019-05-15 MED FILL — ONDANSETRON ODT 8 MG TABLET: 8 | 3 days supply | Qty: 10 | Fill #0

## 2019-11-24 MED FILL — ONDANSETRON ODT 8 MG TABLET: 8 | 3 days supply | Qty: 10 | Fill #1

## 2019-12-03 ENCOUNTER — Encounter: Payer: Self-pay | Admitting: Obstetrics and Gynecology

## 2021-03-22 NOTE — Progress Notes (Deleted)
45 y.o. G0P0000 Single White or Caucasian Not Hispanic or Latino female here for annual exam.      No LMP recorded. (Menstrual status: IUD).          Sexually active: {yes no:314532}  The current method of family planning is {contraception:315051}.    Exercising: {yes no:314532}  {types:19826} Smoker:  {YES NO:22349}  Health Maintenance: Pap:  02/03/18 neg  HR HPV Neg 01-05-15 WNL NEG HR HPV      History of abnormal Pap:  yes MMG:  12/03/19 Bi-rads 1 benign *** BMD:   n/a Colonoscopy: 03/08/17 normal F/U 5 years  TDaP:  utd per patient  Gardasil: none    reports that she has never smoked. She has never used smokeless tobacco. She reports current alcohol use of about 5.0 - 6.0 standard drinks per week. She reports that she does not use drugs.  Past Medical History:  Diagnosis Date   Fibroid    History of migraine with aura    Ovarian cyst    Rheumatoid arteritis ([place])     Past Surgical History:  Procedure Laterality Date   COLONOSCOPY WITH PROPOFOL N/A 03/08/2017   Procedure: COLONOSCOPY WITH PROPOFOL;  Surgeon: Jonathon Bellows, MD;  Location: [facility] ENDOSCOPY;  Service: Gastroenterology;  Laterality: N/A;   COLPOSCOPY     ESOPHAGOGASTRODUODENOSCOPY (EGD) WITH PROPOFOL N/A 03/08/2017   Procedure: ESOPHAGOGASTRODUODENOSCOPY (EGD) WITH PROPOFOL;  Surgeon: Jonathon Bellows, MD;  Location[facility] ENDOSCOPY;  Service: Gastroenterology;  Laterality: N/A;   INTRAUTERINE DEVICE (IUD) INSERTION  2016   Mirena    WISDOM TOOTH EXTRACTION      Current Outpatient Medications  Medication Sig Dispense Refill   cetirizine (ZYRTEC) 10 MG tablet Take 10 mg by mouth daily.     leflunomide (ARAVA) 20 MG tablet TAKE 1 2 TABLET BY MOUTH DAILY  2   levonorgestrel (MIRENA) 20 MCG/24HR IUD 1 Intra Uterine Device (1 each total) by Intrauterine route once. 1 each 0   omeprazole (PRILOSEC) 20 MG capsule Take 20 mg by mouth daily.     No current facility-administered medications for this visit.    Family History   Problem Relation Age of Onset   Cancer Mother        gallbladder   Colon cancer Father 30   Breast cancer Sister 68   Seizures Paternal Uncle    Breast cancer Maternal Grandmother    Colon cancer Paternal Grandfather 25    Review of Systems  Exam:   There were no vitals taken for this visit.  Weight change: @WEIGHTCHANGE @ Height:      Ht Readings from Last 3 Encounters:  03/09/19 5\' 8"  (1.727 m)  02/03/18 5\' 8"  (1.727 m)  03/08/17 5\' 8"  (1.727 m)    General appearance: alert, cooperative and appears stated age Head: Normocephalic, without obvious abnormality, atraumatic Neck: no adenopathy, supple, symmetrical, trachea midline and thyroid {CHL AMB PHY EX THYROID NORM DEFAULT:[phone]::"normal to inspection and palpation"} Lungs: clear to auscultation bilaterally Cardiovascular: regular rate and rhythm Breasts: {Exam; breast:13139::"normal appearance, no masses or tenderness"} Abdomen: soft, non-tender; non distended,  no masses,  no organomegaly Extremities: extremities normal, atraumatic, no cyanosis or edema Skin: Skin color, texture, turgor normal. No rashes or lesions Lymph nodes: Cervical, supraclavicular, and axillary nodes normal. No abnormal inguinal nodes palpated Neurologic: Grossly normal   Pelvic: External genitalia:  no lesions              Urethra:  normal appearing urethra with no masses, tenderness or lesions  Bartholins and Skenes: normal                 Vagina: normal appearing vagina with normal color and discharge, no lesions              Cervix: {CHL AMB PHY EX CERVIX NORM DEFAULT:[phone]::"no lesions"}               Bimanual Exam:  Uterus:  {CHL AMB PHY EX UTERUS NORM [phone]::"normal size, contour, position, consistency, mobility, non-tender"}              Adnexa: {CHL AMB PHY EX ADNEXA NO MASS [phone]::"no mass, fullness, tenderness"}               Rectovaginal: Confirms               Anus:  normal sphincter  tone, no lesions  *** chaperoned for the exam.  A:  Well Woman with normal exam  P:

## 2021-03-29 ENCOUNTER — Ambulatory Visit: Payer: PRIVATE HEALTH INSURANCE | Admitting: Obstetrics and Gynecology

## 2021-04-18 NOTE — Progress Notes (Signed)
45 y.o. G0P0000 Single White or Caucasian Not Hispanic or Latino female here for annual exam.  She has a mirena IUD, placed in 9/16. Just occasional spotting. Occasional cramps, mild.  Sexually active, lives with her long term partner. Engaged.   Rheumatoid arthritis, stable.   No bowel or bladder c/o.     No LMP recorded. (Menstrual status: IUD).          Sexually active: Yes.    The current method of family planning is IUD.   Placed 01/12/2015  Exercising: Yes.     Walking  Smoker:  no  Health Maintenance: Pap:  02/03/18 neg  HR HPV Neg 01-05-15 WNL NEG HR HPV      History of abnormal Pap:  yes, many years ago, colpo was negative.  MMG:  04/05/20 Bi-rads 2 benign  BMD:   n/a Colonoscopy: 11/18, f/u in 5 years.  TDaP:   up to date per patient  Gardasil: none    reports that she has never smoked. She has never used smokeless tobacco. She reports current alcohol use of about 5.0 - 6.0 standard drinks per week. 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   Past Medical History:  Diagnosis Date   Fibroid    History of migraine with aura    Ovarian cyst    Rheumatoid arteritis ([place])     Past Surgical History:  Procedure Laterality Date   COLONOSCOPY WITH PROPOFOL N/A 03/08/2017   Procedure: COLONOSCOPY WITH PROPOFOL;  Surgeon: Jonathon Bellows, MD;  Location: [facility] ENDOSCOPY;  Service: Gastroenterology;  Laterality: N/A;   COLPOSCOPY     ESOPHAGOGASTRODUODENOSCOPY (EGD) WITH PROPOFOL N/A 03/08/2017   Procedure: ESOPHAGOGASTRODUODENOSCOPY (EGD) WITH PROPOFOL;  Surgeon: Jonathon Bellows, MD;  Location: [facility] ENDOSCOPY;  Service: Gastroenterology;  Laterality: N/A;   INTRAUTERINE DEVICE (IUD) INSERTION  2016   Mirena    WISDOM TOOTH EXTRACTION      Current Outpatient Medications  Medication Sig Dispense Refill   cetirizine (ZYRTEC) 10 MG tablet Take 10 mg by mouth daily.     leflunomide (ARAVA) 20 MG tablet TAKE 1 2 TABLET BY MOUTH DAILY  2   levonorgestrel (MIRENA) 20 MCG/24HR IUD 1  Intra Uterine Device (1 each total) by Intrauterine route once. 1 each 0   omeprazole (PRILOSEC) 20 MG capsule Take 20 mg by mouth daily.     No current facility-administered medications for this visit.    Family History  Problem Relation Age of Onset   Cancer Mother        gallbladder   Colon cancer Father 37   Breast cancer Sister 69   Seizures Paternal Uncle    Breast cancer Maternal Grandmother    Colon cancer Paternal Grandfather 50  Sister was 50 when diagnosed with breast cancer. She did have a variant of uncertain significance.   Review of Systems  All other systems reviewed and are negative. She checked a random glucose on herself after having a steroid shot and a milkshake, her glucose was over 300  Exam:   BP 130/78   Pulse (!) 104   Ht 5\' 8"  (1.727 m)   Wt 185 lb (83.9 kg)   SpO2 99%   BMI 28.13 kg/m   Weight change: @WEIGHTCHANGE @ Height:   Height: 5\' 8"  (172.7 cm)  Ht Readings from Last 3 Encounters:  04/19/21 5\' 8"  (1.727 m)  03/09/19 5\' 8"  (1.727 m)  02/03/18 5\' 8"  (1.727 m)    General appearance: alert, cooperative and  appears stated age Head: Normocephalic, without obvious abnormality, atraumatic Neck: no adenopathy, supple, symmetrical, trachea midline and thyroid normal to inspection and palpation Lungs: clear to auscultation bilaterally Cardiovascular: regular rate and rhythm Breasts: normal appearance, no masses or tenderness Abdomen: soft, non-tender; non distended,  no masses,  no organomegaly Extremities: extremities normal, atraumatic, no cyanosis or edema Skin: Skin color, texture, turgor normal. No rashes or lesions Lymph nodes: Cervical, supraclavicular, and axillary nodes normal. No abnormal inguinal nodes palpated Neurologic: Grossly normal   Pelvic: External genitalia:  no lesions              Urethra:  normal appearing urethra with no masses, tenderness or lesions              Bartholins and Skenes: normal                 Vagina:  normal appearing vagina with normal color and discharge, no lesions              Cervix: no lesions and IUD string 2 cm               Bimanual Exam:  Uterus:  normal size, contour, position, consistency, mobility, non-tender              Adnexa: no mass, fullness, tenderness               Rectovaginal: Confirms               Anus:  normal sphincter tone, no lesions    1. Well woman exam Discussed breast self exam Discussed calcium and vit D intake Some labs with Rheumatology  2. IUD check up Doing well  3. Family history of breast cancer Sister was 52 at diagnosis, now in her 43's. She had a genetic mutation of uncertain significance - Ambulatory referral to Genetics  4. Family history of colon cancer -Colonoscopy is UTD - Ambulatory referral to Genetics  5. Laboratory exam ordered as part of routine general medical examination - Lipid panel  6. History of elevated glucose - Hemoglobin A1c

## 2021-04-19 ENCOUNTER — Ambulatory Visit (INDEPENDENT_AMBULATORY_CARE_PROVIDER_SITE_OTHER): Payer: No Typology Code available for payment source | Admitting: Obstetrics and Gynecology

## 2021-04-19 ENCOUNTER — Other Ambulatory Visit: Payer: Self-pay

## 2021-04-19 ENCOUNTER — Encounter: Payer: Self-pay | Admitting: Obstetrics and Gynecology

## 2021-04-19 VITALS — BP 130/78 | HR 104 | Ht 68.0 in | Wt 185.0 lb

## 2021-04-19 DIAGNOSIS — Z800 Family history of malignant neoplasm of digestive organs: Secondary | ICD-10-CM

## 2021-04-19 DIAGNOSIS — Z0000 Encounter for general adult medical examination without abnormal findings: Secondary | ICD-10-CM

## 2021-04-19 DIAGNOSIS — Z30431 Encounter for routine checking of intrauterine contraceptive device: Secondary | ICD-10-CM | POA: Diagnosis not present

## 2021-04-19 DIAGNOSIS — Z803 Family history of malignant neoplasm of breast: Secondary | ICD-10-CM | POA: Diagnosis not present

## 2021-04-19 DIAGNOSIS — Z01419 Encounter for gynecological examination (general) (routine) without abnormal findings: Secondary | ICD-10-CM | POA: Diagnosis not present

## 2021-04-19 DIAGNOSIS — Z8639 Personal history of other endocrine, nutritional and metabolic disease: Secondary | ICD-10-CM

## 2021-04-19 NOTE — Patient Instructions (Signed)

## 2021-04-20 LAB — LIPID PANEL
Cholesterol: 195 mg/dL (ref <200)
HDL: 85 mg/dL (ref >=50)
LDL Cholesterol (Calc): 96 mg/dL (calc)
Non-HDL Cholesterol (Calc): 110 mg/dL (calc) (ref <130)
Total CHOL/HDL Ratio: 2.3 (calc) (ref <5.0)
Triglycerides: 48 mg/dL (ref <150)

## 2021-04-20 LAB — HEMOGLOBIN A1C
Hgb A1c MFr Bld: 4.8 % of total Hgb (ref <5.7)
Mean Plasma Glucose: 91 mg/dL
eAG (mmol/L): 5 mmol/L

## 2021-04-21 ENCOUNTER — Telehealth: Payer: Self-pay | Admitting: Genetic Counselor

## 2021-04-21 NOTE — Telephone Encounter (Signed)
Scheduled appt per 12/14 referral. Pt is aware of appt date and time.  °

## 2021-05-22 ENCOUNTER — Other Ambulatory Visit: Payer: Self-pay | Admitting: Genetic Counselor

## 2021-05-22 DIAGNOSIS — Z803 Family history of malignant neoplasm of breast: Secondary | ICD-10-CM

## 2021-05-23 ENCOUNTER — Encounter: Payer: Self-pay | Admitting: Genetic Counselor

## 2021-05-24 ENCOUNTER — Inpatient Hospital Stay: Payer: PRIVATE HEALTH INSURANCE | Attending: Genetic Counselor | Admitting: Genetic Counselor

## 2021-05-24 ENCOUNTER — Encounter: Payer: Self-pay | Admitting: Genetic Counselor

## 2021-05-24 ENCOUNTER — Other Ambulatory Visit: Payer: Self-pay

## 2021-05-24 ENCOUNTER — Inpatient Hospital Stay: Payer: PRIVATE HEALTH INSURANCE

## 2021-05-24 DIAGNOSIS — Z803 Family history of malignant neoplasm of breast: Secondary | ICD-10-CM

## 2021-05-24 DIAGNOSIS — Z800 Family history of malignant neoplasm of digestive organs: Secondary | ICD-10-CM

## 2021-05-24 LAB — GENETIC SCREENING ORDER

## 2021-05-24 NOTE — Progress Notes (Signed)
REFERRING PROVIDER: Salvadore Dom, MD [address] [place],  [place] [address]  PRIMARY PROVIDER:  Pcp, No  PRIMARY REASON FOR VISIT:  1. Family history of breast cancer   2. 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 Talbert Swanson due to a family history of breast and colon.  [name] presents to clinic today to discuss the possibility of a hereditary predisposition to cancer, genetic testing, and to further clarify her future cancer risks, as well as potential cancer risks for family members.   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  Her sister reportedly had breast cancer at age 19 and had negative genetic testing.  That was in 2002.  She is not aware that her sister has had updated testing.  CANCER HISTORY:  Oncology History   No history exists.     RISK FACTORS:  Menarche was at age 26-14.  First live birth at age N/A.  OCP use for approximately  15  years.  Ovaries intact: yes.  Hysterectomy: no.  Menopausal status: premenopausal.  HRT use: 0 years. Colonoscopy: yes; normal. Mammogram within the last year: no. Number of breast biopsies: 0. Up to date with pelvic exams: yes. Any excessive radiation exposure in the past: no  Past Medical History:  Diagnosis Date   Fibroid    History of migraine with aura    Ovarian cyst    Rheumatoid arteritis ([place])     Past Surgical History:  Procedure Laterality Date   COLONOSCOPY WITH PROPOFOL N/A 03/08/2017   Procedure: COLONOSCOPY WITH PROPOFOL;  Surgeon: Elizabeth Bellows, MD;  Location: [facility] ENDOSCOPY;  Service: Gastroenterology;  Laterality: N/A;   COLPOSCOPY     ESOPHAGOGASTRODUODENOSCOPY (EGD) WITH PROPOFOL N/A 03/08/2017   Procedure: ESOPHAGOGASTRODUODENOSCOPY (EGD) WITH PROPOFOL;  Surgeon: Elizabeth Bellows, MD;  Location: [facility] ENDOSCOPY;  Service: Gastroenterology;   Laterality: N/A;   INTRAUTERINE DEVICE (IUD) INSERTION  2016   Mirena    WISDOM TOOTH EXTRACTION      Social History   Socioeconomic History   Marital status: Single    Spouse name: Not on file   Number of children: Not on file   Years of education: Not on file   Highest education level: Not on file  Occupational History   Occupation: physician  Tobacco Use   Smoking status: Never   Smokeless tobacco: Never  Vaping Use   Vaping Use: Never used  Substance and Sexual Activity   Alcohol use: Yes    Alcohol/week: 5.0 - 6.0 standard drinks    Types: 3 Cans of beer, 2 - 3 Standard drinks or equivalent per week   Drug use: No   Sexual activity: Yes    Partners: Male    Birth control/protection: I.U.D.  Other Topics Concern   Not on file  Social History Narrative   The patient is an ER MD for [employer]   Social Determinants of Health   Financial Resource Strain: Not on file  Food Insecurity: Not on file  Transportation Needs: Not on file  Physical Activity: Not on file  Stress: Not on file  Social Connections: Not on file     FAMILY HISTORY:  We obtained a detailed, 4-generation family history.  Significant diagnoses are listed below: Family History  Problem Relation Age of Onset  Cancer Mother 19       gallbladder   Colon cancer Father 35   Breast cancer Sister 65   Seizures Paternal Uncle    Breast cancer Maternal Grandmother 35   Heart disease Maternal Grandfather    Lupus Paternal Grandmother    Colon cancer Paternal Grandfather 58   Breast cancer Other 58       MGM's mother   Colon cancer Other 23       PGF;s mother    The patient does not have children.  She has two sisters, one who had breast cancer at 76.  Her mother is deceased and her father is living.  The patient's mother had gallbladder cancer at age 97.  She had two brothers who are cancer free.  The maternal grandparents are deceased.  The grandmother had breast cancer at 11, and her mother had  breast cancer in her 76's.  The patient's father had colon cancer at 58.  He is an only child.  The paternal grandparents are deceased.  The grandmother had lupus and died in her 69's and the grandfather had colon cancer around age 52.  His mother also had colon cancer in her 45's.  [name] is aware of previous family history of genetic testing for hereditary cancer risks, but this testing occurred 20 years ago.  We discussed discussing with her sister to get this updated. Patient's maternal ancestors are of [place] descent, and paternal ancestors are of [place] descent. There is no reported Ashkenazi Jewish ancestry. There is no known consanguinity.  GENETIC COUNSELING ASSESSMENT: Elizabeth Swanson is a 46 y.o. female with a family history of breast and colon cancer which is somewhat suggestive of a hereditary cancer syndrome and predisposition to cancer given the number of people in the family with similar cancers, and the young ages of onset. We, therefore, discussed and recommended the following at today's visit.   DISCUSSION: We discussed that, in general, most cancer is not inherited in families, but instead is sporadic or familial. Sporadic cancers occur by chance and typically happen at older ages (>50 years) as this type of cancer is caused by genetic changes acquired during an individuals lifetime. Some families have more cancers than would be expected by chance; however, the ages or types of cancer are not consistent with a known genetic mutation or known genetic mutations have been ruled out. This type of familial cancer is thought to be due to a combination of multiple genetic, environmental, hormonal, and lifestyle factors. While this combination of factors likely increases the risk of cancer, the exact source of this risk is not currently identifiable or testable.  We discussed that 5 - 10% of breast cancer is hereditary, with most cases associated with BRCA mutations.  There are other genes that can  be associated with hereditary breast cancer syndromes.  These include ATM, CHEK2 and PALB2.  Colon cancer can also be associated with hereditary mutations, most commonly Lynch syndrome, but there are other hereditary colon cancer syndromes we can test for as well.  We discussed that testing is beneficial for several reasons including knowing how to follow individuals and understand if other family members could be at risk for cancer and allow them to undergo genetic testing.   We reviewed the characteristics, features and inheritance patterns of hereditary cancer syndromes. We also discussed genetic testing, including the appropriate family members to test, the process of testing, insurance coverage and turn-around-time for results. We discussed the implications of a  negative, positive, carrier and/or variant of uncertain significant result. We recommended [name] pursue genetic testing for the CancerNext-Expanded+RNAinsight gene panel.   The CancerNext-Expanded gene panel offered by [facility] and includes sequencing and rearrangement analysis for the following 77 genes: AIP, ALK, APC*, ATM*, AXIN2, BAP1, BARD1, BLM, BMPR1A, BRCA1*, BRCA2*, BRIP1*, CDC73, CDH1*, CDK4, CDKN1B, CDKN2A, CHEK2*, CTNNA1, DICER1, FANCC, FH, FLCN, GALNT12, KIF1B, LZTR1, MAX, MEN1, MET, MLH1*, MSH2*, MSH3, MSH6*, MUTYH*, NBN, NF1*, NF2, NTHL1, PALB2*, PHOX2B, PMS2*, POT1, PRKAR1A, PTCH1, PTEN*, RAD51C*, RAD51D*, RB1, RECQL, RET, SDHA, SDHAF2, SDHB, SDHC, SDHD, SMAD4, SMARCA4, SMARCB1, SMARCE1, STK11, SUFU, TMEM127, TP53*, TSC1, TSC2, VHL and XRCC2 (sequencing and deletion/duplication); EGFR, EGLN1, HOXB13, KIT, MITF, PDGFRA, POLD1, and POLE (sequencing only); EPCAM and GREM1 (deletion/duplication only). DNA and RNA analyses performed for * genes.   Based on Dr. Kallie Edward family history of cancer, she meets medical criteria for genetic testing. Despite that she meets criteria, she may still have an out of pocket cost. We discussed  that if her out of pocket cost for testing is over $100, the laboratory will call and confirm whether she wants to proceed with testing.  If the out of pocket cost of testing is less than $100 she will be billed by the genetic testing laboratory.   Based on the patient's family history, a statistical model (Tyrer Cusik) was used to estimate her risk of developing breast cancer. This estimates her lifetime risk of developing breast to be approximately 22.3%. This estimation does not consider any genetic testing results.  The patient's lifetime breast cancer risk is a preliminary estimate based on available information using one of several models endorsed by the [place] (ACS). The ACS recommends consideration of breast MRI screening as an adjunct to mammography for patients at high risk (defined as 20% or greater lifetime risk). Please note that a woman's breast cancer risk changes over time. It may increase or decrease based on age and any changes to the personal and/or family medical history. The risks and recommendations listed above apply to this patient at this point in time. In the future, she may or may not be eligible for the same medical management strategies and, in some cases, other medical management strategies may become available to her. If she is interested in an updated breast cancer risk assessment at a later date, she can contact us.  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  Therefore, we recommend that annual screening with mammography and breast MRI be performed.  We discussed that [name] should discuss her individual situation with her referring physician and determine a breast cancer screening plan with which they are both comfortable.      PLAN: After considering the risks, benefits, and limitations, [name] provided informed consent to pursue genetic testing and the blood sample was sent to [facility] for analysis of the  CancerNext-Expanded+RNAinsight. Results should be available within approximately 2-3 weeks' time, at which point they will be disclosed by telephone to [name], as will any additional recommendations warranted by these results. [name] will receive a summary of her genetic counseling visit and a copy of her results once available. This information will also be available in Epic.   Based on Dr. Kallie Edward family history, we recommended her sister, who was diagnosed with breast cancer at age 66, have genetic counseling and repeat testing. [name] will let us know if we can be of any assistance in coordinating genetic  counseling and/or testing for this family member.   Lastly, we encouraged [name] to remain in contact with cancer genetics annually so that we can continuously update the family history and inform her of any changes in cancer genetics and testing that may be of benefit for this family.   Dr. Kallie Edward questions were answered to her satisfaction today. Our contact information was provided should additional questions or concerns arise. Thank you for the referral and allowing [place] to share in the care of your patient.   Audel Coakley P. Florene Glen, [place], [facility] Licensed, Insurance risk surveyor [email or web address] phone: [phone]  The patient was seen for a total of 30 minutes in face-to-face genetic counseling.  The patient was seen alone.  This patient was discussed with Drs. Magrinat, Lindi Adie and/or Burr Medico who agrees with the above.    _______________________________________________________________________ For Office Staff:  Number of people involved in session: 1 Was an Intern/ student involved with case: no

## 2021-06-08 ENCOUNTER — Encounter: Payer: Self-pay | Admitting: Genetic Counselor

## 2021-06-08 ENCOUNTER — Telehealth: Payer: Self-pay | Admitting: Genetic Counselor

## 2021-06-08 DIAGNOSIS — Z1211 Encounter for screening for malignant neoplasm of colon: Secondary | ICD-10-CM | POA: Insufficient documentation

## 2021-06-08 DIAGNOSIS — Z1379 Encounter for other screening for genetic and chromosomal anomalies: Secondary | ICD-10-CM | POA: Insufficient documentation

## 2021-06-08 NOTE — Telephone Encounter (Signed)
LM on VM that results are back and to please call back. 

## 2021-06-09 ENCOUNTER — Encounter: Payer: Self-pay | Admitting: Obstetrics and Gynecology

## 2021-06-09 ENCOUNTER — Ambulatory Visit: Payer: Self-pay | Admitting: Genetic Counselor

## 2021-06-09 DIAGNOSIS — Z1379 Encounter for other screening for genetic and chromosomal anomalies: Secondary | ICD-10-CM

## 2021-06-09 NOTE — Progress Notes (Addendum)
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 Please refer to our prior cancer genetics clinic note for more information regarding our discussion, assessment and recommendations, at the time. Dr. Kallie Edward recent genetic test results were disclosed to her, as were recommendations warranted by these results. These results and recommendations are discussed in more detail below.  CANCER HISTORY:  Oncology History   No history exists.    FAMILY HISTORY:  We obtained a detailed, 4-generation family history.  Significant diagnoses are listed below: Family History  Problem Relation Age of Onset   Cancer Mother 19       gallbladder   Colon cancer Father 35   Breast cancer Sister 28   Seizures Paternal Uncle    Breast cancer Maternal Grandmother 32   Heart disease Maternal Grandfather    Lupus Paternal Grandmother    Colon cancer Paternal Grandfather 59   Breast cancer Other 92       MGM's mother   Colon cancer Other 16       PGF;s mother      The patient does not have children.  She has two sisters, one who had breast cancer at 11.  Her mother is deceased and her father is living.   The patient's mother had gallbladder cancer at age 70.  She had two brothers who are cancer free.  The maternal grandparents are deceased.  The grandmother had breast cancer at 34, and her mother had breast cancer in her 28's.   The patient's father had colon cancer at 28.  He is an only child.  The paternal grandparents are deceased.  The grandmother had lupus and died in her 106's and the grandfather had colon cancer around age 44.  His mother also had colon cancer in her 7's.   Dr. Ralene Bathe is aware of previous family history of genetic testing for hereditary cancer risks, but this testing occurred 20 years ago.  We discussed discussing with her sister to get this updated.  Patient's maternal ancestors are of [place] descent, and paternal ancestors are of [place] descent. There is no reported Ashkenazi Jewish ancestry. There is no known consanguinity.  GENETIC TEST RESULTS: Genetic testing reported out on June 07, 2021 through the CancerNext-Expanded+RNAinsight cancer panel found no pathogenic mutations. The CancerNext-Expanded gene panel offered by [facility] and includes sequencing and rearrangement analysis for the following 77 genes: AIP, ALK, APC*, ATM*, AXIN2, BAP1, BARD1, BLM, BMPR1A, BRCA1*, BRCA2*, BRIP1*, CDC73, CDH1*, CDK4, CDKN1B, CDKN2A, CHEK2*, CTNNA1, DICER1, FANCC, FH, FLCN, GALNT12, KIF1B, LZTR1, MAX, MEN1, MET, MLH1*, MSH2*, MSH3, MSH6*, MUTYH*, NBN, NF1*, NF2, NTHL1, PALB2*, PHOX2B, PMS2*, POT1, PRKAR1A, PTCH1, PTEN*, RAD51C*, RAD51D*, RB1, RECQL, RET, SDHA, SDHAF2, SDHB, SDHC, SDHD, SMAD4, SMARCA4, SMARCB1, SMARCE1, STK11, SUFU, TMEM127, TP53*, TSC1, TSC2, VHL and XRCC2 (sequencing and deletion/duplication); EGFR, EGLN1, HOXB13, KIT, MITF, PDGFRA, POLD1, and POLE (sequencing only); EPCAM and GREM1 (deletion/duplication only). DNA and RNA analyses performed for * genes. The test report has been scanned into EPIC and is located under the Molecular Pathology section of the Results Review tab.  A portion of the result report is included below for reference.     We discussed with Dr. Ralene Bathe that because current genetic testing is not perfect, it is possible there may be a gene mutation in one of these genes that current testing cannot detect, but that chance is  small.  We also discussed, that there could be another gene that has not yet been discovered, or that we have not yet tested, that is responsible for the cancer diagnoses in the family. It is also possible there is a hereditary cause for the cancer in the family that Dr. Ralene Bathe did not inherit and therefore was not identified in her testing.  Therefore, it is important to remain in touch with cancer genetics  in the future so that we can continue to offer Dr. Ralene Bathe the most up to date genetic testing.   ADDITIONAL GENETIC TESTING: We discussed with Dr. Ralene Bathe that her genetic testing was fairly extensive.  If there are genes identified to increase cancer risk that can be analyzed in the future, we would be happy to discuss and coordinate this testing at that time.    CANCER SCREENING RECOMMENDATIONS: Dr. Kallie Edward test result is considered negative (normal).  This means that we have not identified a hereditary cause for her family history of breast and colon cancer at this time. Most cancers happen by chance and this negative test suggests that her cancer may fall into this category.    While reassuring, this does not definitively rule out a hereditary predisposition to cancer. It is still possible that there could be genetic mutations that are undetectable by current technology. There could be genetic mutations in genes that have not been tested or identified to increase cancer risk.  Therefore, it is recommended she continue to follow the cancer management and screening guidelines provided by her primary healthcare provider.   An individual's cancer risk and medical management are not determined by genetic test results alone. 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 her Tyrer-Cuzick risk score is 21.2%.  For women with a greater than 20% lifetime risk of breast cancer, the [employer]  (NCCN) recommends the following:   1.      Clinical encounter every 6-12 months to begin when identified as being at increased risk, but not before age 33  2.      Annual mammograms. Tomosynthesis is recommended starting 10 years earlier than the  youngest breast cancer diagnosis in the family or at age 20 (whichever comes first), but not before age 46    3.      Annual breast MRI starting 10 years earlier than the youngest breast cancer diagnosis in the family or at age 90 (whichever comes first), but not before age 68.   We, therefore, discussed that it is reasonable for Dr. Ralene Bathe to be followed by a high-risk breast cancer clinic; in addition to a yearly mammogram and physical exam by a healthcare provider, she should discuss the usefulness of an annual breast MRI with the high-risk clinic providers.    RECOMMENDATIONS FOR FAMILY MEMBERS:  Individuals in this family might be at some increased risk of developing cancer, over the general population risk, simply due to the family history of cancer.  We recommended women in this family have a yearly mammogram beginning at age 75, or 59 years younger than the earliest onset of cancer, an annual clinical breast exam, and perform monthly breast self-exams. Women in this family should also have a gynecological exam as recommended by their primary provider. All family members should be referred  for colonoscopy starting at age 72.  It is also possible there is a hereditary cause for the cancer in Dr. Kallie Edward family that she did not inherit and therefore was not identified in her.  Based on Dr. Kallie Edward family history, we recommended her sister, who was diagnosed with breast cancer at age 5, have updated genetic counseling and testing. If her sister tests positive for a genetic syndrome, and Dr. Ralene Bathe did not, then this would make Dr. Ralene Bathe' test a true negative result.  At that time she would not be at a greater risk for cancer than average.  Dr. Ralene Bathe will let us know if we can be of any assistance in coordinating genetic counseling and/or testing for this family member.   FOLLOW-UP: Lastly, we discussed with Dr. Ralene Bathe that cancer genetics is a rapidly advancing field and it is possible that new genetic tests  will be appropriate for her and/or her family members in the future. We encouraged her to remain in contact with cancer genetics on an annual basis so we can update her personal and family histories and let her know of advances in cancer genetics that may benefit this family.   Our contact number was provided. Dr. Kallie Edward questions were answered to her satisfaction, and she knows she is welcome to call us at anytime with additional questions or concerns.   Roma Kayser, [place], [facility] Licensed, Certified Genetic Counselor [email or web address]

## 2021-06-09 NOTE — Telephone Encounter (Signed)
Left 2nd message on VM that results are back and to please call.

## 2021-06-09 NOTE — Telephone Encounter (Signed)
Revealed negative genetic testing.  Discussed that we do not know why there is cancer in the family. It could be due to a different gene that we are not testing, or maybe our current technology may not be able to pick something up.  It will be important for her to keep in contact with genetics to keep up with whether additional testing may be needed.  

## 2021-06-21 ENCOUNTER — Telehealth: Payer: Self-pay | Admitting: Hematology and Oncology

## 2021-06-21 NOTE — Telephone Encounter (Signed)
Scheduled appt per 2/3 referral. Spoke to pt who is aware of appt date and time. Pt requested early morning appt due to work sch.

## 2021-07-12 NOTE — Progress Notes (Signed)
?[place] ?CONSULT NOTE ? ?Patient Care Team: ?Pcp, No as PCP - General ?Salvadore Dom, MD as Consulting Physician (Obstetrics and Gynecology) ?Octaviano Batty, MD as Referring Physician (Rheumatology) ? ?CHIEF COMPLAINTS/PURPOSE OF CONSULTATION:  ?Newly diagnosed high risk of breast cancer ? 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 Her sister, maternal grandmother, and maternal great grandmother had breast cancer. Her father, paternal grandfather, and paternal great grandmother had colon cancer, and her mother had gallbladder cancer. Screening mammogram on 06/09/2021 showed no evidence of malignancy. She presents to the clinic today for initial evaluation and discussion of treatment options.  ? ?I reviewed her records extensively and collaborated the history with the patient. ? ?MEDICAL HISTORY:  ?Past Medical History:  ?Diagnosis Date  ? Fibroid   ? History of migraine with aura   ? Ovarian cyst   ? Rheumatoid arteritis ([place])   ? ? ?SURGICAL HISTORY: ?Past Surgical History:  ?Procedure Laterality Date  ? COLONOSCOPY WITH PROPOFOL N/A 03/08/2017  ? Procedure: COLONOSCOPY WITH PROPOFOL;  Surgeon: Jonathon Bellows, MD;  Location[facility] ENDOSCOPY;  Service: Gastroenterology;  Laterality: N/A;  ? COLPOSCOPY    ? ESOPHAGOGASTRODUODENOSCOPY (EGD) WITH PROPOFOL N/A 03/08/2017  ? Procedure: ESOPHAGOGASTRODUODENOSCOPY (EGD) WITH PROPOFOL;  Surgeon: Jonathon Bellows, MD;  Location: [facility] ENDOSCOPY;  Service: Gastroenterology;  Laterality: N/A;  ? INTRAUTERINE DEVICE (IUD) INSERTION  2016  ? Mirena   ? WISDOM TOOTH EXTRACTION    ? ? ?SOCIAL HISTORY: ?Social History  ? ?Socioeconomic History  ? Marital status: Single  ?  Spouse name: Not on file  ? Number of children: Not on file  ? Years of education: Not on file  ? Highest education level: Not on file  ?Occupational History  ? Occupation: physician   ?Tobacco Use  ? Smoking status: Never  ? Smokeless tobacco: Never  ?Vaping Use  ? Vaping Use: Never used  ?Substance and Sexual Activity  ? Alcohol use: Yes  ?  Alcohol/week: 5.0 - 6.0 standard drinks  ?  Types: 3 Cans of beer, 2 - 3 Standard drinks or equivalent per week  ? Drug use: No  ? Sexual activity: Yes  ?  Partners: Male  ?  Birth control/protection: I.U.D.  ?Other Topics Concern  ? Not on file  ?Social History Narrative  ? The patient is an ER MD for [employer]  ? ?Social Determinants of Health  ? ?Financial Resource Strain: Not on file  ?Food Insecurity: Not on file  ?Transportation Needs: Not on file  ?Physical Activity: Not on file  ?Stress: Not on file  ?Social Connections: Not on file  ?Intimate Partner Violence: Not on file  ? ? ?FAMILY HISTORY: ?Family History  ?Problem Relation Age of Onset  ? Cancer Mother 11  ?     gallbladder  ? Colon cancer Father 73  ? Breast cancer Sister 50  ? Seizures Paternal Uncle   ? Breast cancer Maternal Grandmother 21  ? Heart disease Maternal Grandfather   ? Lupus Paternal Grandmother   ? Colon cancer Paternal Grandfather 7  ? Breast cancer Other 80  ?     MGM's mother  ? Colon cancer Other 72  ?     PGF;s mother  ? ? ?ALLERGIES:  is allergic to amoxicillin and enbrel [etanercept]. ? ?MEDICATIONS:  ?Current Outpatient Medications  ?Medication Sig Dispense Refill  ? cetirizine (ZYRTEC)  10 MG tablet Take 10 mg by mouth daily.    ? leflunomide (ARAVA) 20 MG tablet TAKE 1 2 TABLET BY MOUTH DAILY  2  ? levonorgestrel (MIRENA) 20 MCG/24HR IUD 1 Intra Uterine Device (1 each total) by Intrauterine route once. 1 each 0  ? omeprazole (PRILOSEC) 20 MG capsule Take 20 mg by mouth daily.    ? ?No current facility-administered medications for this visit.  ? ?  ? ?PHYSICAL EXAMINATION: ?ECOG PERFORMANCE STATUS: 0 - Asymptomatic ? ?Vitals:  ? 07/13/21 0815  ?BP: (!) 153/83  ?Pulse: 67  ?Resp: 18  ?Temp: (!) 97.5 ?F (36.4 ?C)  ?SpO2: 100%  ? ?Filed Weights  ? 07/13/21 0815  ?Weight: 190  lb 3.2 oz (86.3 kg)  ? ? ?  ? ?LABORATORY DATA:  ?I have reviewed the data as listed ?Lab Results  ?Component Value Date  ? WBC 5.2 03/09/2019  ? HGB 13.9 03/09/2019  ? HCT 42.3 03/09/2019  ? MCV 92 03/09/2019  ? PLT 268 03/09/2019  ? ?Lab Results  ?Component Value Date  ? NA 139 03/09/2019  ? K 4.2 03/09/2019  ? CL 105 03/09/2019  ? CO2 22 03/09/2019  ? ? ?RADIOGRAPHIC STUDIES: ?I have personally reviewed the radiological reports and agreed with the findings in the report. ? ?ASSESSMENT AND PLAN:  ?At high risk for breast cancer ?1.  Breast cancer risk: ?Tyrer-Cuzick: ?10-year risk: 6.4% (average risk 2.2%) ?Lifetime risk: 27% (10.3% average risk) ?[place] Janus risk: 1.6% ? ?2. Risk reduction strategies: We discussed different risk reducing strategies for future breast cancer risk.  Based on the [facility] risk model, I do not recommend risk reducing tamoxifen or raloxifene. ? ?3. Lifestyle modification:   ? ?4. Surveillance: Patient certainly would be a good candidate for ongoing mammograms on a yearly basis.  We discussed the role of breast MRIs and discussed the risks and benefits of doing MRIs including the risk of false positives and requiring additional biopsies.  Ultimately we decided not to perform breast MRIs.  Her breast density is a category B which makes the 3D mammograms very sensitive. ? ?5.  Genetics: Genetic testing: Negative for mutations. ?Return to clinic on an as-needed basis. 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 ? ? ? ?All questions were answered. The patient knows to call the clinic with any problems, questions or concerns. ?  ?Rulon Eisenmenger, MD, MPH ?07/13/2021  ? ? I, Thana Ates, am acting as scribe for Nicholas Lose, MD. ? ?I have reviewed the above documentation for accuracy and completeness, and I agree with the above. ? ? ? ? ?

## 2021-07-13 ENCOUNTER — Inpatient Hospital Stay: Payer: PRIVATE HEALTH INSURANCE | Attending: Genetic Counselor | Admitting: Hematology and Oncology

## 2021-07-13 ENCOUNTER — Other Ambulatory Visit: Payer: Self-pay

## 2021-07-13 DIAGNOSIS — Z803 Family history of malignant neoplasm of breast: Secondary | ICD-10-CM | POA: Insufficient documentation

## 2021-07-13 DIAGNOSIS — N83209 Unspecified ovarian cyst, unspecified side: Secondary | ICD-10-CM | POA: Diagnosis not present

## 2021-07-13 DIAGNOSIS — I00 Rheumatic fever without heart involvement: Secondary | ICD-10-CM | POA: Diagnosis not present

## 2021-07-13 DIAGNOSIS — Z800 Family history of malignant neoplasm of digestive organs: Secondary | ICD-10-CM | POA: Diagnosis not present

## 2021-07-13 DIAGNOSIS — Z9189 Other specified personal risk factors, not elsewhere classified: Secondary | ICD-10-CM

## 2021-07-13 NOTE — Assessment & Plan Note (Signed)
1.  Breast cancer risk: ?Tyrer-Cuzick: ?10-year risk: ?Lifetime risk: ? ?2. Risk reduction strategies: We discussed different risk reducing strategies for future breast cancer risk. We discussed chemoprevention and lifestyle modification. We discussed role of ongoing surveillance with mammograms versus MRIs. Patient would be a good candidate for chemoprevention with  tamoxifen or Evista or aromatase inhibitors. We discussed different medications available. We discussed their particular side effects. With tamoxifen side effects would include but not limited to cataract axilla radiation, thrombosis, uterine malignancy, hot flashes mood swings. With Evista patient would and could experience mood swings hot flashes aches and pains and possibly thrombosis. Aromatase inhibitors side effects including but not limiting to bone loss, aches and pains, hyperlipidemia, hot flashes and mood swings.  Patient felt more comfortable with starting evista. I recommended 60 mg daily for a total of 5 years. Literature was given to her. ? ?3. Lifestyle modification: We discussed different interventions exercise at least 6 days a week including both aerobic as well as weight-bearing exercises and strength training. He discussed dietary modification including increasing number of servings of fruit and vegetables as well as decreased in number of servings of meat. We discussed the importance of maintaining a good BMI. Certainly patient eliminating or limiting alcohol consumption. We discussed smoking cessation are avoiding starting smoking habits. ? ?4. Surveillance: Patient certainly would be a good candidate for ongoing mammograms on a yearly basis. Certainly she could benefit from a 3-D mammogram. We discussed self breast examination as well as ongoing clinical examination.  ? ?5.  Treatment: Patient was given a prescription for tamoxifen. Risks benefits and side effects were discussed with her. Literature was given to her and reviewed  with her. A prescription for Evista 60 mg daily  was given and sent to her pharmacy. ? ?6. Genetics: due to family history I have recommended that she be seen by our genetic counselor for counseling and for possible testing. ? ?7. Followup: Patient will be seen back in 6 months time in followup. ? ? ?

## 2021-07-21 NOTE — Progress Notes (Signed)
? ? ?Subjective:   ? ?CC: R hip pain ? ?I, Wendy Poet, LAT, ATC, am serving as scribe for Dr. Lynne Leader. ? ?HPI: Pt is a 46 y/o female presenting w/ c/o R hip pain x one year that has been worsening over the last 1-2 months.  She locates her pain to R post hip, post thigh and post calf.  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 ? ?Radiating pain: yes ?Low back pain: no ?Aggravating factors: sitting; laying on her R side and laying supine ?Treatments tried: heat; walking; NSAIDs; lidoderm; Voltaren gel ? ?Pertinent review of Systems: No fevers or chills ? ?Relevant historical information: Rheumatoid arthritis currently controlled ? ? ?Objective:   ? ?Vitals:  ? 07/24/21 0755  ?BP: 132/82  ?Pulse: 64  ?SpO2: 98%  ? ?General: Well Developed, well nourished, and in no acute distress.  ? ?MSK: L-spine: Nontender midline. ?Lower extremity strength is intact except noted below. ?Reflexes and sensation are intact. ?Straight leg raise test not significantly positive. ? ?Right hip: Normal appearing ?Tender palpation at greater trochanter. ?Normal hip motion. ?Positive crossover compression stretch and positive figure 4 stretch. ?Hip abduction and external rotation strength are decreased 4/5 with some pain. ? ?Normal gait ? ?Lab and Radiology Results ? ?X-ray images L-spine and right hip obtained today personally and independently interpreted ? ?Right hip: No significant DJD.  No acute fractures. ? ?L-spine: No acute fractures.  No severe degenerative changes. ? ?Await formal radiology review ? ? ? ?Impression and Recommendations:   ? ?Assessment and Plan: ?46 y.o. female with  ?Right lateral hip pain and right lumbar radicular pain in an L5 dermatomal pattern.  I think it is likely that she has 2 separate issues.  I think she probably has trochanteric bursitis and probably L5 lumbar radiculopathy.  She may have piriformis syndrome instead of true L5 radiculopathy.   She is a good candidate for physical therapy.  Plan for referral to PT and check back in 8 weeks.  We discussed medications.  She like to hold off on prednisone and gabapentin for now. ?Return sooner if needed.. ? ?PDMP not reviewed this encounter. ?Orders Placed This Encounter  ?Procedures  ? DG Lumbar Spine 2-3 Views  ?  Standing Status:   Future  ?  Number of Occurrences:   1  ?  Standing Expiration Date:   08/24/2021  ?  Order Specific Question:   Reason for Exam (SYMPTOM  OR DIAGNOSIS REQUIRED)  ?  Answer:   R leg pain  ?  Order Specific Question:   Is patient pregnant?  ?  Answer:   No  ?  Order Specific Question:   Preferred imaging location?  ?  Answer:   [facility]  ? DG HIP UNILAT W OR W/O PELVIS 2-3 VIEWS RIGHT  ?  Standing Status:   Future  ?  Number of Occurrences:   1  ?  Standing Expiration Date:   08/24/2021  ?  Order Specific Question:   Reason for Exam (SYMPTOM  OR DIAGNOSIS REQUIRED)  ?  Answer:   R hip pain  ?  Order Specific Question:   Is patient pregnant?  ?  Answer:   No  ?  Order Specific Question:   Preferred imaging location?  ?  Answer:   [facility]  ? Ambulatory referral to Physical Therapy  ?  Referral Priority:   Routine  ?  Referral  Type:   Physical Medicine  ?  Referral Reason:   Specialty Services Required  ?  Requested Specialty:   Physical Therapy  ?  Number of Visits Requested:   1  ? ?No orders of the defined types were placed in this encounter. ? ? ?Discussed warning signs or symptoms. Please see discharge instructions. Patient expresses understanding. ? ? ?The above documentation has been reviewed and is accurate and complete Lynne Leader, M.D. ? ?

## 2021-07-24 ENCOUNTER — Other Ambulatory Visit: Payer: Self-pay

## 2021-07-24 ENCOUNTER — Ambulatory Visit (INDEPENDENT_AMBULATORY_CARE_PROVIDER_SITE_OTHER): Payer: PRIVATE HEALTH INSURANCE

## 2021-07-24 ENCOUNTER — Encounter: Payer: Self-pay | Admitting: Family Medicine

## 2021-07-24 ENCOUNTER — Ambulatory Visit: Payer: PRIVATE HEALTH INSURANCE | Admitting: Family Medicine

## 2021-07-24 VITALS — BP 132/82 | HR 64 | Ht 68.0 in | Wt 183.8 lb

## 2021-07-24 DIAGNOSIS — M25551 Pain in right hip: Secondary | ICD-10-CM | POA: Diagnosis not present

## 2021-07-24 DIAGNOSIS — G8929 Other chronic pain: Secondary | ICD-10-CM

## 2021-07-24 DIAGNOSIS — M5416 Radiculopathy, lumbar region: Secondary | ICD-10-CM

## 2021-07-24 DIAGNOSIS — M79604 Pain in right leg: Secondary | ICD-10-CM

## 2021-07-24 NOTE — Patient Instructions (Addendum)
Nice to meet you today. ? ?I've referred you to Physical Therapy @ [facility].  Their office will call you to schedule but please let us know if you don't hear from them in one week regarding scheduling. ? ?Please get an Xray today before you leave. ? ?Follow-up: 8 weeks ?

## 2021-07-26 NOTE — Progress Notes (Signed)
Lumbar spine x-ray does not show any fractures or significant arthritis.

## 2021-07-26 NOTE — Progress Notes (Signed)
Right hip x-ray looks pretty normal to radiology.

## 2021-09-13 ENCOUNTER — Ambulatory Visit (INDEPENDENT_AMBULATORY_CARE_PROVIDER_SITE_OTHER): Payer: PRIVATE HEALTH INSURANCE | Admitting: Family Medicine

## 2021-09-13 VITALS — BP 122/82 | HR 78 | Ht 68.0 in | Wt 180.0 lb

## 2021-09-13 DIAGNOSIS — M5416 Radiculopathy, lumbar region: Secondary | ICD-10-CM

## 2021-09-13 MED ORDER — GABAPENTIN 300 MG PO CAPS: 300.0000 mg | ORAL_CAPSULE | Freq: Three times a day (TID) | ORAL | 3 refills | Status: DC | PRN

## 2021-09-13 NOTE — Progress Notes (Signed)
? ?I, Judy Pimple, am serving as a Education administrator for Dr. Lynne Leader. ? 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 Pt was last seen by Dr. Georgina Snell 07/24/21 and was referred to [facility] PT. Today, pt reports that she was doing better but hurt it again a week ago. States the pain is back but not as bad as it was. However she has the strength. Patient is currently going to PT she has about 2-3 weeks left. Patient thinks the new exercises she got at PT could be the culprit of the pain returning but not 100% ? ?Dx imaging: 07/24/21 L-spine & R hip XR ? ?Pertinent review of systems: No fevers or chills ? ?Relevant historical information: Rheumatoid arthritis ? ? ?Exam:  ?BP 122/82   Pulse 78   Ht '5\' 8"'$  (1.727 m)   Wt 180 lb (81.6 kg)   SpO2 98%   BMI 27.37 kg/m?  ?General: Well Developed, well nourished, and in no acute distress.  ? ?MSK: L-spine: Nontender midline.  Normal lumbar motion. ?Lower extremity strength is intact. ?Positive slump and straight leg raise test right leg. ? ?Right hip: Normal-appearing ?Minimally tender to palpation greater trochanter. ?Hip abduction strength diminished 4/5.  External rotation strength is intact. ?Nontender at piriformis area. ? ? ?Lab and Radiology Results ? ?EXAM: ?LUMBAR SPINE - 2-3 VIEW ?  ?COMPARISON:  None. ?  ?FINDINGS: ?There is no evidence of lumbar spine fracture. Alignment is normal. ?Intervertebral disc spaces are maintained. T-shaped intrauterine ?device overlies the pelvis with exact positioning unclear on ?radiograph. ?  ?IMPRESSION: ?1. No acute displaced fracture or traumatic listhesis of the lumbar ?spine. ?2. T-shaped intrauterine device overlies the pelvis with exact ?positioning unclear on radiograph. ?  ?  ?Electronically Signed ?  By: Iven Finn M.D. ?  On: 07/26/2021 01:53 ?  ?I, Lynne Leader,  personally (independently) visualized and performed the interpretation of the images attached in this note. ? ? ? ? ?Assessment and Plan: ?46 y.o. female with lumbar radiculopathy right leg.  Distribution most likely L5.  Plan for MRI at this point to further evaluate source of pain and for potential epidural steroid injection planning.  Additionally she may have some continued hip abductor tendinitis component but this is resolved most significantly with physical therapy. ?Following MRI likely can discuss the results of the MRI over the phone and order epidural steroid injection without an office visit. ? ?For now limiting gabapentin for symptom control especially at bedtime. ? ? ?PDMP not reviewed this encounter. ?Orders Placed This Encounter  ?Procedures  ? MR Lumbar Spine Wo Contrast  ?  [facility] at [facility]  ?  Standing Status:   Future  ?  Standing Expiration Date:   09/14/2022  ?  Order Specific Question:   What is the patient's sedation requirement?  ?  Answer:   No Sedation  ?  Order Specific Question:   Does the patient have a pacemaker or implanted devices?  ?  Answer:   No  ?  Order Specific Question:   Preferred imaging location?  ?  Answer:   External  ? ?Meds ordered this encounter  ?Medications  ? gabapentin (NEURONTIN) 300 MG capsule  ?  Sig: Take 1 capsule (300 mg total) by mouth 3 (three) times daily as needed.  ?  Dispense:  90 capsule  ?  Refill:  3  ? ? ? ?Discussed warning signs or symptoms. Please see discharge instructions. Patient expresses understanding. ? ? ?The above documentation has been reviewed and is accurate and complete Lynne Leader, M.D. ? ? ?

## 2021-09-13 NOTE — Patient Instructions (Addendum)
Good to see you  ? ?You should hear from MRI scheduling within 1 week. If you do not hear please let me know.   ? ?We can probably do a phone call about the results and plan.  ? ?Probably will be an ESI.  ? ?OK to continue exercises.  ? ? ?

## 2021-10-16 ENCOUNTER — Telehealth: Payer: Self-pay | Admitting: Family Medicine

## 2021-10-16 DIAGNOSIS — M5416 Radiculopathy, lumbar region: Secondary | ICD-10-CM

## 2021-10-16 NOTE — Telephone Encounter (Signed)
Called pt and left detailed message on her VM (ok per DPR) w/ info regarding lumbar ESI order and that she needs to call [facility] to schedule.

## 2021-10-16 NOTE — Telephone Encounter (Signed)
Lumbar spine MRI showed potential pinched nerve BL at L4-5 and L5-S1.   I have ordered an epidural steroid injection at [facility].  Please contact them to schedule the injection.  Phone number is [phone].

## 2022-05-29 IMAGING — DX DG LUMBAR SPINE 2-3V
3 series · 3 of 3 positions shown · non-contrast
Comparison: None.

CLINICAL DATA: R leg pain

EXAM:
LUMBAR SPINE - 2-3 VIEW

[l-spine ap]
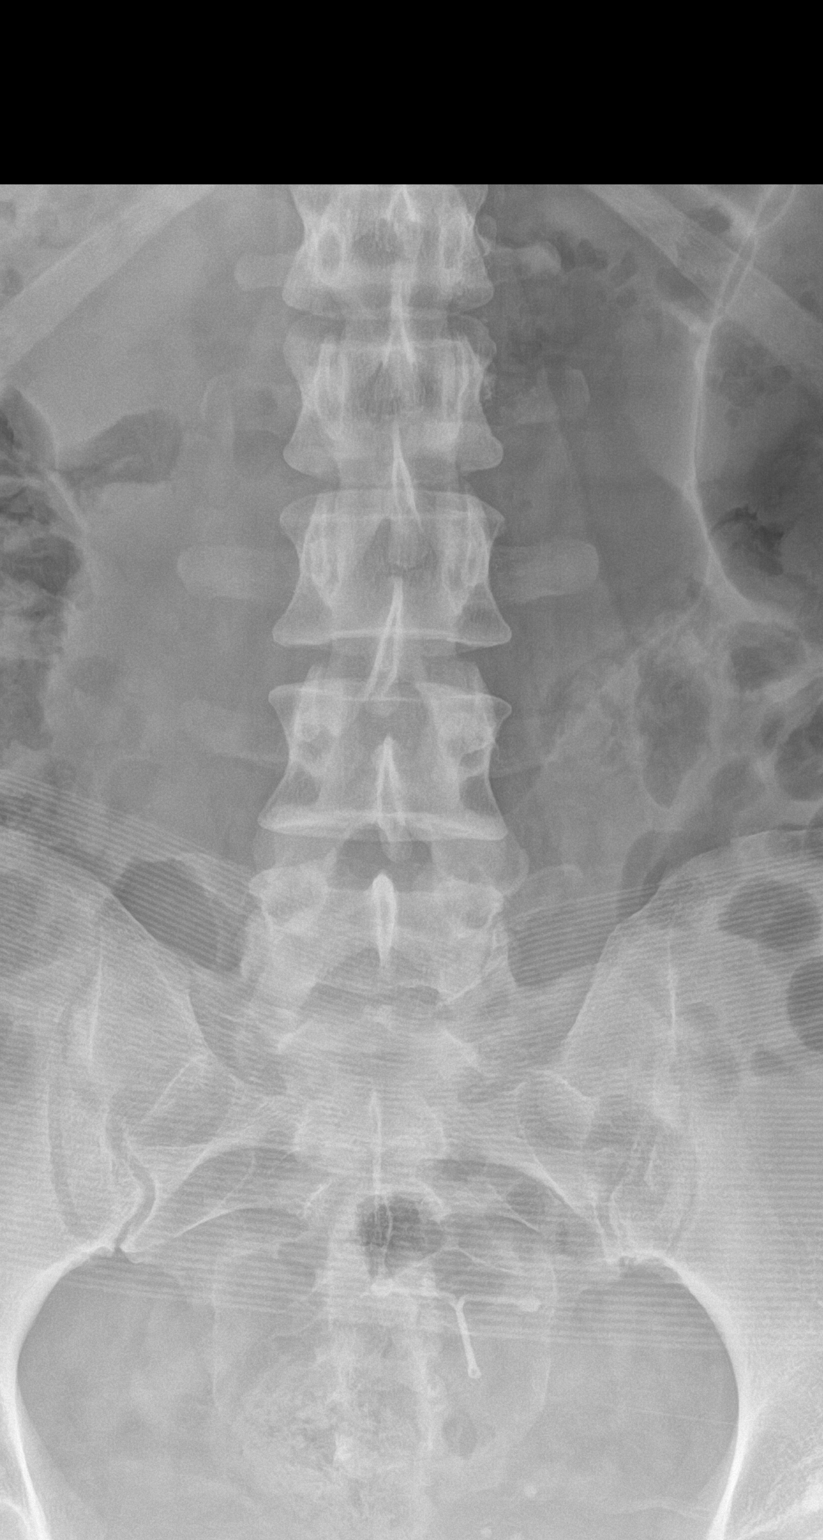

[l-spine lateral]
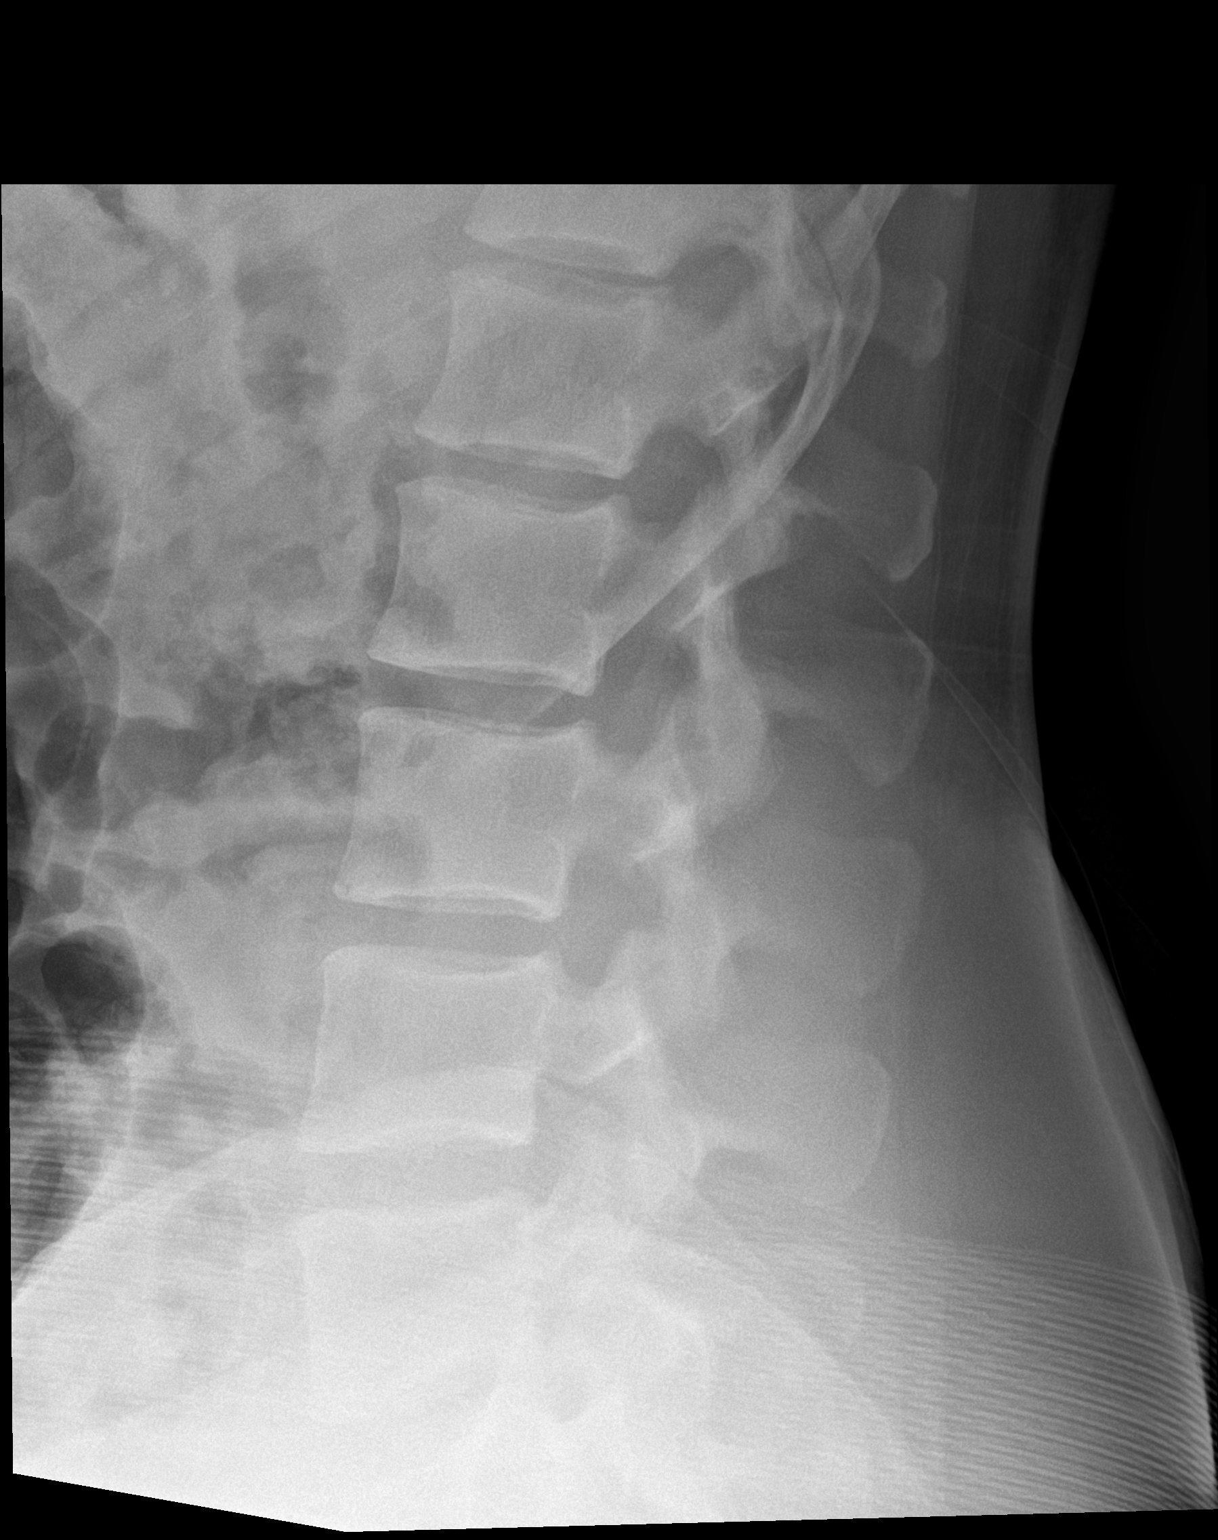

[l-spine spot]
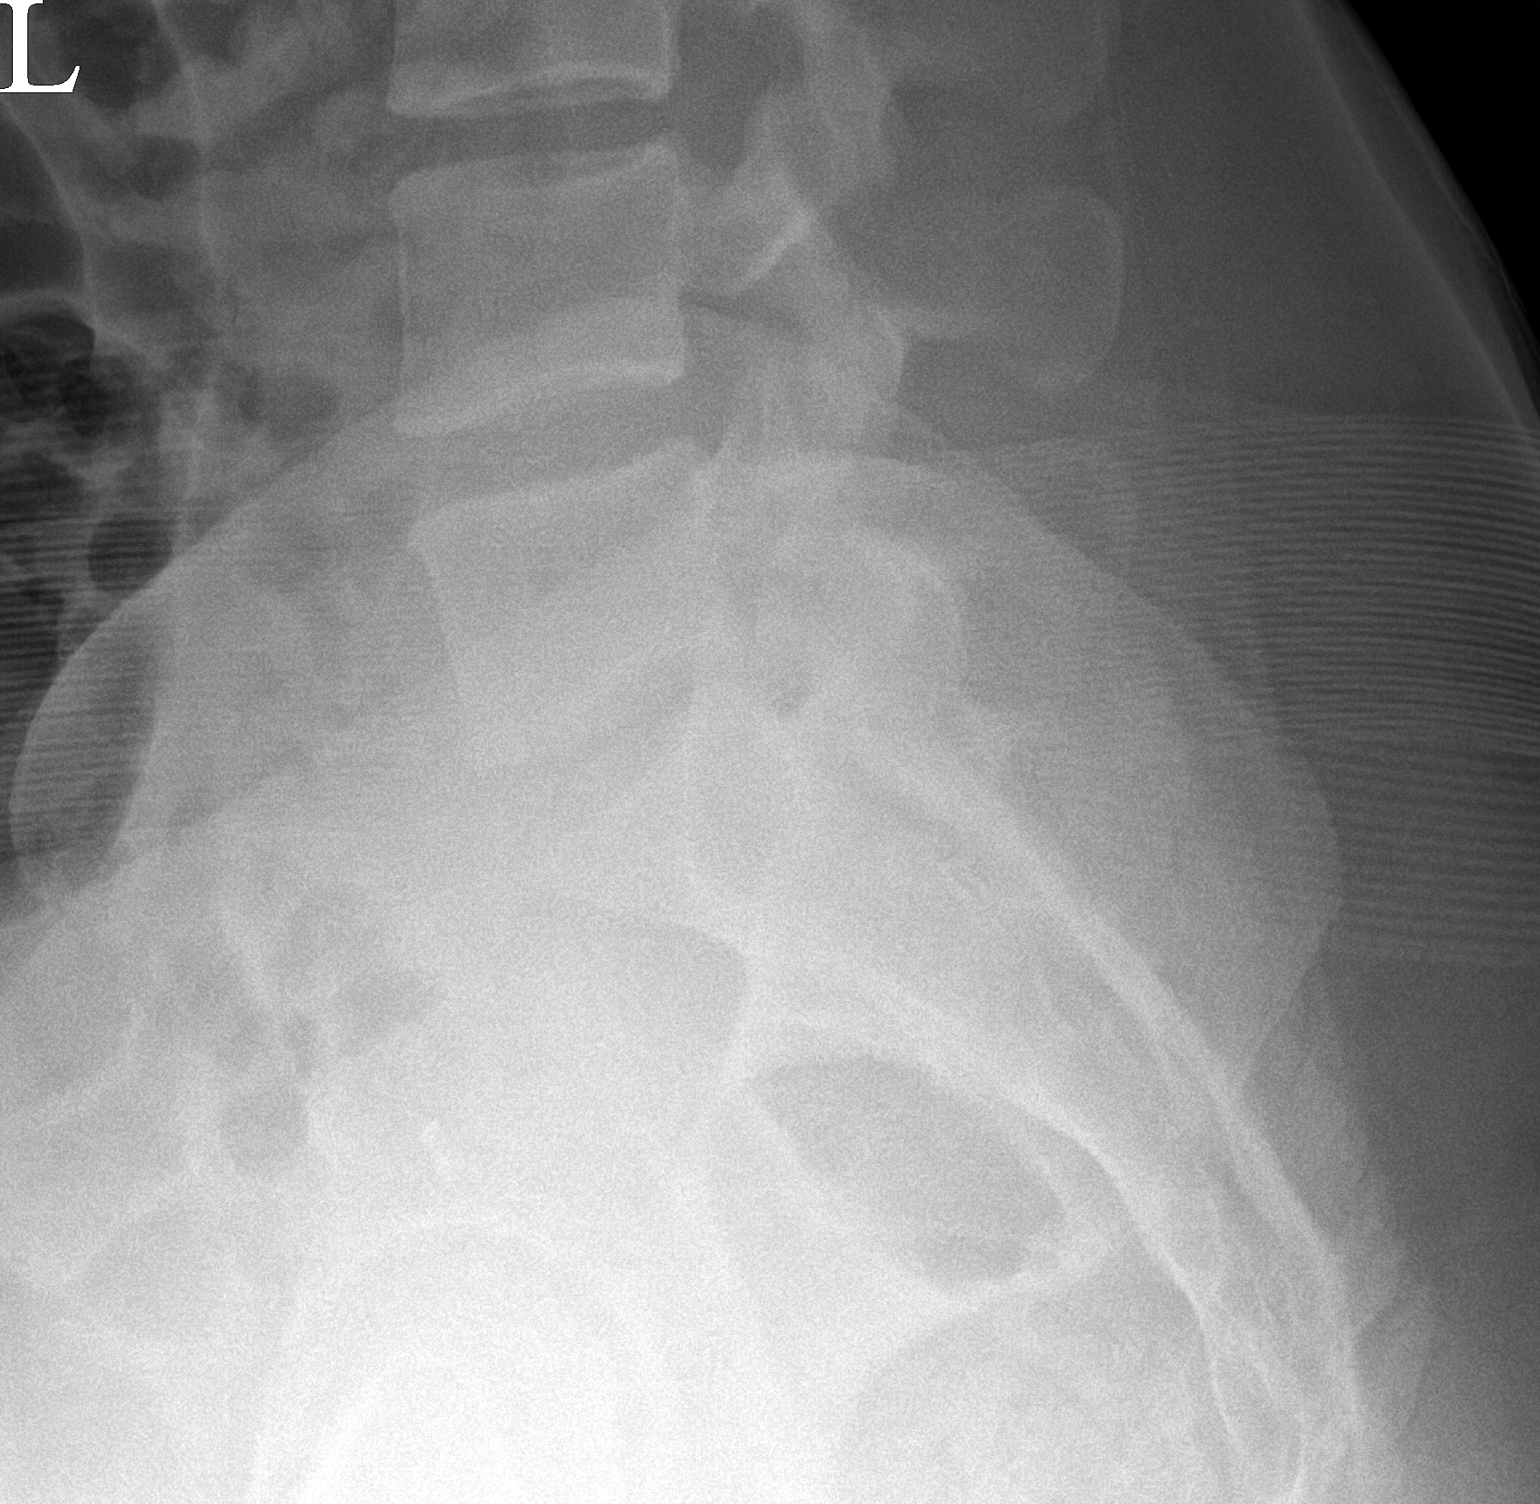

[3 of 3 positions shown; findings below may reference images not displayed]

FINDINGS: There is no evidence of lumbar spine fracture. Alignment is normal.
Intervertebral disc spaces are maintained. T-shaped intrauterine
device overlies the pelvis with exact positioning unclear on
radiograph.
IMPRESSION: 1. No acute displaced fracture or traumatic listhesis of the lumbar
spine.
2. T-shaped intrauterine device overlies the pelvis with exact
positioning unclear on radiograph.

## 2023-02-14 ENCOUNTER — Ambulatory Visit (INDEPENDENT_AMBULATORY_CARE_PROVIDER_SITE_OTHER): Payer: PRIVATE HEALTH INSURANCE | Admitting: Obstetrics and Gynecology

## 2023-02-14 ENCOUNTER — Encounter: Payer: Self-pay | Admitting: Obstetrics and Gynecology

## 2023-02-14 ENCOUNTER — Other Ambulatory Visit (HOSPITAL_COMMUNITY)
Admission: RE | Admit: 2023-02-14 | Discharge: 2023-02-14 | Disposition: A | Discharge status: Home / Self Care | Payer: PRIVATE HEALTH INSURANCE | Source: Ambulatory Visit | Attending: Obstetrics and Gynecology | Admitting: Obstetrics and Gynecology

## 2023-02-14 VITALS — BP 110/68 | HR 85 | Ht 67.75 in | Wt 186.0 lb

## 2023-02-14 DIAGNOSIS — Z30431 Encounter for routine checking of intrauterine contraceptive device: Secondary | ICD-10-CM

## 2023-02-14 DIAGNOSIS — Z1211 Encounter for screening for malignant neoplasm of colon: Secondary | ICD-10-CM

## 2023-02-14 DIAGNOSIS — M05711 Rheumatoid arthritis with rheumatoid factor of right shoulder without organ or systems involvement: Secondary | ICD-10-CM

## 2023-02-14 DIAGNOSIS — Z01419 Encounter for gynecological examination (general) (routine) without abnormal findings: Secondary | ICD-10-CM

## 2023-02-14 DIAGNOSIS — Z0000 Encounter for general adult medical examination without abnormal findings: Secondary | ICD-10-CM

## 2023-02-14 MED ORDER — LEVONORGESTREL 20 MCG/DAY IU IUD
1.0000 | INTRAUTERINE_SYSTEM | Freq: Once | INTRAUTERINE | Status: AC
Start: 2023-02-14 — End: 2023-03-26
  Administered 2023-03-26: 1 via INTRAUTERINE

## 2023-02-14 NOTE — Progress Notes (Signed)
47 y.o. y.o. female here for annual exam.  Reports some hot flashes noted when she wakes up Has mirena IUD since 2016 and is pleased with it  No LMP recorded. (Menstrual status: IUD). Period Pattern:  (spotting with iud rarely)  Health Maintenance: Mirena IUD since 2016- to schedule removal and placement of new mirena- referral placed for new one Pap:  02/03/18 neg  HR HPV Neg 01-05-15 WNL NEG HR HPV      History of abnormal Pap:  yes, many years ago, colpo was negative.  MMG: 3/22 /23 Bi-rads 1 benign  BMD:   2020 [facility]. Normal. Repeat in 5 years. Risk factor with RA. No h/o fractures- referral placed Colonoscopy: 11/18, f/u in 5 years. -referral placed TDaP:   up to date per patient  Gardasil: none  Blood pressure 110/68, pulse 85, height 5' 7.75" (1.721 m), weight 186 lb (84.4 kg), SpO2 97%.     Component Value Date/Time   DIAGPAP  02/03/2018 0000    NEGATIVE FOR INTRAEPITHELIAL LESIONS OR MALIGNANCY.   ADEQPAP  02/03/2018 0000    Satisfactory for evaluation  endocervical/transformation zone component PRESENT.    GYN HISTORY:    Component Value Date/Time   DIAGPAP  02/03/2018 0000    NEGATIVE FOR INTRAEPITHELIAL LESIONS OR MALIGNANCY.   ADEQPAP  02/03/2018 0000    Satisfactory for evaluation  endocervical/transformation zone component PRESENT.    OB History  Gravida Para Term Preterm AB Living  0 0 0 0 0 0  SAB IAB Ectopic Multiple Live Births  0 0 0 0 0    Past Medical History:  Diagnosis Date   Fibroid    History of migraine with aura    Ovarian cyst    Rheumatoid arteritis (HCC)     Past Surgical History:  Procedure Laterality Date   COLONOSCOPY WITH PROPOFOL N/A 03/08/2017   Procedure: COLONOSCOPY WITH PROPOFOL;  Surgeon: Wyline Mood, MD;  Location: [facility] ENDOSCOPY;  Service: Gastroenterology;  Laterality: N/A;   COLPOSCOPY     ESOPHAGOGASTRODUODENOSCOPY (EGD) WITH PROPOFOL N/A 03/08/2017   Procedure: ESOPHAGOGASTRODUODENOSCOPY (EGD) WITH PROPOFOL;   Surgeon: Wyline Mood, MD;  Location: [facility] ENDOSCOPY;  Service: Gastroenterology;  Laterality: N/A;   INTRAUTERINE DEVICE (IUD) INSERTION  2016   Mirena    WISDOM TOOTH EXTRACTION      Current Outpatient Medications on File Prior to Visit  Medication Sig Dispense Refill   cetirizine (ZYRTEC) 10 MG tablet Take 10 mg by mouth daily.     leflunomide (ARAVA) 10 MG tablet Take 1 tablet by mouth daily.     levonorgestrel (MIRENA) 20 MCG/24HR IUD 1 Intra Uterine Device (1 each total) by Intrauterine route once. 1 each 0   omeprazole (PRILOSEC) 20 MG capsule Take 20 mg by mouth daily.     SIMPONI 50 MG/0.5ML SOAJ Inject into the skin.     No current facility-administered medications on file prior to visit.    Social History   Socioeconomic History   Marital status: Single    Spouse name: Not on file   Number of children: Not on file   Years of education: Not on file   Highest education level: Not on file  Occupational History   Occupation: physician  Tobacco Use   Smoking status: Never   Smokeless tobacco: Never  Vaping Use   Vaping status: Never Used  Substance and Sexual Activity   Alcohol use: Not Currently    Comment: 2-3   Drug use: No  Sexual activity: Yes    Partners: Male    Birth control/protection: I.U.D.    Comment: mirena  Other Topics Concern   Not on file  Social History Narrative   The patient is an ER MD for [employer]   Social Determinants of Health   Financial Resource Strain: Not on file  Food Insecurity: Not on file  Transportation Needs: Not on file  Physical Activity: Not on file  Stress: Not on file  Social Connections: Not on file  Intimate Partner Violence: Not on file    Family History  Problem Relation Age of Onset   Cancer Mother 65       gallbladder   Colon cancer Father 34   Breast cancer Sister 53   Seizures Paternal Uncle    Breast cancer Maternal Grandmother 50   Heart disease Maternal Grandfather    Lupus Paternal Grandmother     Colon cancer Paternal Grandfather 39   Breast cancer Other 19       MGM's mother   Colon cancer Other 36       PGF;s mother     Allergies  Allergen Reactions   Amoxicillin Rash   Enbrel [Etanercept] Hives      Patient's last menstrual period was No LMP recorded. (Menstrual status: IUD)..          Sexually active: yes, engaged    Review of Systems Alls systems reviewed and are negative.     Physical Exam Constitutional:      Appearance: Normal appearance.  Genitourinary:     Vulva and urethral meatus normal.     No lesions in the vagina.     Right Labia: No rash, lesions or skin changes.    Left Labia: No lesions, skin changes or rash.    No vaginal discharge or tenderness.     No vaginal prolapse present.    No vaginal atrophy present.     Right Adnexa: not tender, not palpable and no mass present.    Left Adnexa: not tender, not palpable and no mass present.    No cervical motion tenderness or discharge.     IUD strings visualized.     Uterus is not enlarged, tender or irregular.     Uterus is anteverted.  Breasts:    Right: Normal.     Left: Normal.  HENT:     Head: Normocephalic.  Neck:     Thyroid: No thyroid mass, thyromegaly or thyroid tenderness.  Cardiovascular:     Rate and Rhythm: Normal rate and regular rhythm.     Heart sounds: Normal heart sounds, S1 normal and S2 normal.  Pulmonary:     Effort: Pulmonary effort is normal.     Breath sounds: Normal breath sounds and air entry.  Abdominal:     General: There is no distension.     Palpations: Abdomen is soft. There is no mass.     Tenderness: There is no abdominal tenderness. There is no guarding or rebound.  Musculoskeletal:        General: Normal range of motion.     Cervical back: Full passive range of motion without pain, normal range of motion and neck supple. No tenderness.     Right lower leg: No edema.     Left lower leg: No edema.  Neurological:     Mental Status: She is alert.   Skin:    General: Skin is warm.  Psychiatric:        Mood and  Affect: Mood normal.        Behavior: Behavior normal.        Thought Content: Thought content normal.  Vitals and nursing note reviewed. Exam conducted with a chaperone present.       A:         Well Woman GYN exam, mirena IUD(2016)                             P:        Pap smear collected today Encouraged annual mammogram screening Colon cancer screening referral placed today DXA ordered today for 2025 Labs- see order Discussed breast self exam Encouraged healthy lifestyle practices Encouraged Vit D and Calcium  Return for mirena IUD removal and insertion of new one. To check FSH level today.  Discussed benefit to her bones with estrogen patch, when her level suggest menopause ie 40 above 4 months apart x2  No follow-ups on file.  Earley Favor

## 2023-02-15 LAB — VITAMIN D 25 HYDROXY (VIT D DEFICIENCY, FRACTURES): Vit D, 25-Hydroxy: 37 ng/mL (ref 30–100)

## 2023-02-15 LAB — FOLLICLE STIMULATING HORMONE: FSH: 12 m[IU]/mL

## 2023-02-15 LAB — HEMOGLOBIN A1C
Hgb A1c MFr Bld: 5 %{Hb} (ref <5.7)
Mean Plasma Glucose: 97 mg/dL
eAG (mmol/L): 5.4 mmol/L

## 2023-02-15 LAB — ESTRADIOL: Estradiol: 213 pg/mL

## 2023-02-18 LAB — CYTOLOGY - PAP
Adequacy: ABSENT
Comment: NEGATIVE
Diagnosis: NEGATIVE
High risk HPV: NEGATIVE

## 2023-03-26 ENCOUNTER — Ambulatory Visit (INDEPENDENT_AMBULATORY_CARE_PROVIDER_SITE_OTHER): Payer: PRIVATE HEALTH INSURANCE | Admitting: Obstetrics and Gynecology

## 2023-03-26 ENCOUNTER — Encounter: Payer: Self-pay | Admitting: Obstetrics and Gynecology

## 2023-03-26 VITALS — BP 116/80 | HR 79

## 2023-03-26 DIAGNOSIS — Z30433 Encounter for removal and reinsertion of intrauterine contraceptive device: Secondary | ICD-10-CM

## 2023-03-26 DIAGNOSIS — Z30431 Encounter for routine checking of intrauterine contraceptive device: Secondary | ICD-10-CM

## 2023-03-26 LAB — PREGNANCY, URINE: Preg Test, Ur: NEGATIVE

## 2023-03-26 NOTE — Addendum Note (Signed)
Addended by: Earley Favor on: 03/26/2023 11:30 AM   Modules accepted: Orders

## 2023-03-26 NOTE — Progress Notes (Signed)
Patient with Mirena IUD since 2016.  She is here today for IUD removal and placement of new Mirena IUD.  Patient is pleased with IUD.  Patient consented for removal and the risk benefits alternatives indications of insertion were discussed.  IUD removal procedure: SVE: IUD strings seen and gasped with a vanderbilt and the entire IUD was removed easily.  Both arms and body were seen and intact. Patient tolerated the procedure well.     Gershon Crane, Dr. 02/22/1976 [id number]   History:  47 y.o..  Pt has been counseled about risks and benefits as well as complications.  Consent is obtained today.  Patient's last menstrual period was 03/25/2023.   Past medical history, past surgical history, family history and social history were all reviewed and documented in the EPIC chart.  ROS:  A ROS was performed and pertinent positives and negatives are included.  Exam: Vitals:   03/26/23 0901  BP: 116/80  Pulse: 79  SpO2: 99%   There is no height or weight on file to calculate BMI.  Physical Exam Constitutional:      Appearance: Normal appearance.  Genitourinary:     Right Labia: No rash.    Left Labia: No rash.    No cervical motion tenderness or discharge.     IUD strings visualized.  Musculoskeletal:     Cervical back: Normal range of motion.  Neurological:     Mental Status: She is alert and oriented to person, place, and time.  Psychiatric:        Mood and Affect: Mood normal.        Behavior: Behavior normal.  Vitals and nursing note reviewed.      Assessment/Plan:  Insertion of mirena IUD             UPT neg    Pt aware to call for any concerns and counseled on concerning s/s and when to return. Pt aware removal due no later than 8 years, IUD card given to pt.   Earley Favor

## 2023-03-26 NOTE — Addendum Note (Signed)
Addended by: Earley Favor on: 03/26/2023 12:51 PM   Modules accepted: Orders

## 2023-05-27 ENCOUNTER — Telehealth: Payer: Self-pay

## 2023-05-27 NOTE — Telephone Encounter (Signed)
Pt called to schedule her screening colonoscopy. Pt works nights so please call NO LATER than 9:30am.

## 2023-05-28 ENCOUNTER — Other Ambulatory Visit: Payer: Self-pay | Admitting: *Deleted

## 2023-05-28 ENCOUNTER — Telehealth: Payer: Self-pay | Admitting: *Deleted

## 2023-05-28 DIAGNOSIS — Z1211 Encounter for screening for malignant neoplasm of colon: Secondary | ICD-10-CM

## 2023-05-28 DIAGNOSIS — Z800 Family history of malignant neoplasm of digestive organs: Secondary | ICD-10-CM

## 2023-05-28 MED ORDER — NA SULFATE-K SULFATE-MG SULF 17.5-3.13-1.6 GM/177ML PO SOLN: 1.0000 | Freq: Once | ORAL | 0 refills | Status: AC

## 2023-05-28 NOTE — Telephone Encounter (Signed)
Colonoscopy schedule on 07/08/2023 with Dr Tobi Bastos at [facility]

## 2023-05-28 NOTE — Telephone Encounter (Signed)
Gastroenterology Pre-Procedure Review  Request Date: 07/08/2023 Requesting Physician: Dr. Tobi Bastos  PATIENT REVIEW QUESTIONS: The patient responded to the following health history questions as indicated:    1. Are you having any GI issues? no 2. Do you have a personal history of Polyps? no 3. Do you have a family history of Colon Cancer or Polyps? yes (father had colon cancer) 4. Diabetes Mellitus? no 5. Joint replacements in the past 12 months?no 6. Major health problems in the past 3 months?no 7. Any artificial heart valves, MVP, or defibrillator?no    MEDICATIONS & ALLERGIES:    Patient reports the following regarding taking any anticoagulation/antiplatelet therapy:   Plavix, Coumadin, Eliquis, Xarelto, Lovenox, Pradaxa, Brilinta, or Effient? no Aspirin? no  Patient confirms/reports the following medications:  Current Outpatient Medications  Medication Sig Dispense Refill   cetirizine (ZYRTEC) 10 MG tablet Take 10 mg by mouth daily.     leflunomide (ARAVA) 10 MG tablet Take 1 tablet by mouth daily.     levonorgestrel (MIRENA) 20 MCG/24HR IUD 1 Intra Uterine Device (1 each total) by Intrauterine route once. 1 each 0   omeprazole (PRILOSEC) 20 MG capsule Take 20 mg by mouth daily.     SIMPONI 50 MG/0.5ML SOAJ Inject into the skin.     No current facility-administered medications for this visit.    Patient confirms/reports the following allergies:  Allergies  Allergen Reactions   Amoxicillin Rash   Enbrel [Etanercept] Hives    No orders of the defined types were placed in this encounter.   AUTHORIZATION INFORMATION Primary Insurance: 1D#: Group #:  Secondary Insurance: 1D#: Group #:  SCHEDULE INFORMATION: Date: 07/08/2023 Time: Location:  [facility]

## 2023-05-28 NOTE — Telephone Encounter (Signed)
 Message left for patient to return my call this morning.

## 2023-07-08 ENCOUNTER — Encounter
Admission: RE | Disposition: A | Discharge status: Home / Self Care | Payer: Self-pay | Source: Home / Self Care | Attending: Gastroenterology

## 2023-07-08 ENCOUNTER — Encounter: Payer: Self-pay | Admitting: Gastroenterology

## 2023-07-08 ENCOUNTER — Ambulatory Visit: Payer: PRIVATE HEALTH INSURANCE | Admitting: Anesthesiology

## 2023-07-08 ENCOUNTER — Ambulatory Visit
Admission: RE | Admit: 2023-07-08 | Discharge: 2023-07-08 | Disposition: A | Discharge status: Home / Self Care | Payer: PRIVATE HEALTH INSURANCE | Attending: Gastroenterology | Admitting: Gastroenterology

## 2023-07-08 DIAGNOSIS — Z1211 Encounter for screening for malignant neoplasm of colon: Secondary | ICD-10-CM | POA: Diagnosis not present

## 2023-07-08 DIAGNOSIS — Z800 Family history of malignant neoplasm of digestive organs: Secondary | ICD-10-CM | POA: Diagnosis not present

## 2023-07-08 HISTORY — PX: COLONOSCOPY WITH PROPOFOL: SHX5780

## 2023-07-08 SURGERY — COLONOSCOPY WITH PROPOFOL
Anesthesia: General

## 2023-07-08 MED ORDER — SODIUM CHLORIDE 0.9 % IV SOLN
INTRAVENOUS | Status: DC
Start: 2023-07-08 — End: 2023-07-08

## 2023-07-08 MED ORDER — PROPOFOL 500 MG/50ML IV EMUL
INTRAVENOUS | Status: DC | PRN
Start: 2023-07-08 — End: 2023-07-08
  Administered 2023-07-08: 100 ug via INTRAVENOUS
  Administered 2023-07-08: 200 ug/kg/min via INTRAVENOUS

## 2023-07-08 NOTE — Transfer of Care (Signed)
 Immediate Anesthesia Transfer of Care Note  Patient: Elizabeth Swanson, Dr.  Nigel Bridgeman) Performed: COLONOSCOPY WITH PROPOFOL  Patient Location: PACU  Anesthesia Type:General  Level of Consciousness: awake  Airway & Oxygen Therapy: Patient Spontanous Breathing  Post-op Assessment: Report given to RN and Post -op Vital signs reviewed and stable  Post vital signs: Reviewed and stable  Last Vitals:  Vitals Value Taken Time  BP 107/66 07/08/23 0947  Temp 36.1 C 07/08/23 0947  Pulse 63 07/08/23 0948  Resp 11 07/08/23 0948  SpO2 100 % 07/08/23 0948  Vitals shown include unfiled device data.  Last Pain:  Vitals:   07/08/23 0947  TempSrc: Temporal  PainSc: 0-No pain         Complications: There were no known notable events for this encounter.

## 2023-07-08 NOTE — Anesthesia Preprocedure Evaluation (Signed)
 Anesthesia Evaluation  Patient identified by MRN, date of birth, ID band Patient awake    Reviewed: Allergy & Precautions, NPO status , Patient's Chart, lab work & pertinent test results  History of Anesthesia Complications Negative for: history of anesthetic complications  Airway Mallampati: III  TM Distance: <3 FB Neck ROM: Full    Dental no notable dental hx. (+) Teeth Intact   Pulmonary neg pulmonary ROS, neg sleep apnea, neg COPD, Patient abstained from smoking.Not current smoker   Pulmonary exam normal breath sounds clear to auscultation       Cardiovascular Exercise Tolerance: Good METS(-) hypertension(-) CAD and (-) Past MI negative cardio ROS (-) dysrhythmias  Rhythm:Regular Rate:Normal - Systolic murmurs    Neuro/Psych  Headaches  negative psych ROS   GI/Hepatic ,neg GERD  ,,(+)     (-) substance abuse    Endo/Other  neg diabetes    Renal/GU negative Renal ROS     Musculoskeletal   Abdominal   Peds  Hematology   Anesthesia Other Findings Past Medical History: No date: Fibroid No date: History of migraine with aura No date: Ovarian cyst No date: Rheumatoid arteritis (HCC)  Reproductive/Obstetrics                             Anesthesia Physical Anesthesia Plan  ASA: 2  Anesthesia Plan: General   Post-op Pain Management: Minimal or no pain anticipated   Induction: Intravenous  PONV Risk Score and Plan: 3 and Propofol infusion, TIVA and Ondansetron  Airway Management Planned: Nasal Cannula  Additional Equipment: None  Intra-op Plan:   Post-operative Plan:   Informed Consent: I have reviewed the patients History and Physical, chart, labs and discussed the procedure including the risks, benefits and alternatives for the proposed anesthesia with the patient or authorized representative who has indicated his/her understanding and acceptance.     Dental advisory  given  Plan Discussed with: CRNA and Surgeon  Anesthesia Plan Comments: (Discussed risks of anesthesia with patient, including possibility of difficulty with spontaneous ventilation under anesthesia necessitating airway intervention, PONV, and rare risks such as cardiac or respiratory or neurological events, and allergic reactions. Discussed the role of CRNA in patient's perioperative care. Patient understands.)       Anesthesia Quick Evaluation

## 2023-07-08 NOTE — Anesthesia Postprocedure Evaluation (Signed)
 Anesthesia Post Note  Patient: Elizabeth Swanson, Dr.  Nigel Bridgeman) Performed: COLONOSCOPY WITH PROPOFOL  Patient location during evaluation: Endoscopy Anesthesia Type: General Level of consciousness: awake and alert Pain management: pain level controlled Vital Signs Assessment: post-procedure vital signs reviewed and stable Respiratory status: spontaneous breathing, nonlabored ventilation, respiratory function stable and patient connected to nasal cannula oxygen Cardiovascular status: blood pressure returned to baseline and stable Postop Assessment: no apparent nausea or vomiting Anesthetic complications: no   There were no known notable events for this encounter.   Last Vitals:  Vitals:   07/08/23 1000 07/08/23 1009  BP: 134/81 134/81  Pulse:  60  Resp:  14  Temp:    SpO2:      Last Pain:  Vitals:   07/08/23 1009  TempSrc:   PainSc: 0-No pain                 Corinda Gubler

## 2023-07-08 NOTE — H&P (Signed)
 Wyline Mood, MD [address], [address], [place], [place], [address] [address], [address], [place], [place], [address] Phone: [phone]  Fax: [phone]  Primary Care Physician:  Earley Favor, MD   Pre-Procedure History & Physical: HPI:  Lauree Yurick, Dr. is a 48 y.o. female is here for an colonoscopy.   Past Medical History:  Diagnosis Date   Fibroid    History of migraine with aura    Ovarian cyst    Rheumatoid arteritis (HCC)     Past Surgical History:  Procedure Laterality Date   COLONOSCOPY WITH PROPOFOL N/A 03/08/2017   Procedure: COLONOSCOPY WITH PROPOFOL;  Surgeon: Wyline Mood, MD;  [facility] END[facility]COPY;  Service: Gastroenterology;  Laterality: N/A;   COLPOSCOPY     ESOPHAGOGASTRODUODENOSCOPY (EGD) WITH PROPOFOL N/A 03/08/2017   Procedure: ESOPHAGOGASTRODUODENOSCOPY (EGD) WITH PROPOFOL;  Surgeon: Wyline Mood, MD;  Location: [facility] ENDOSCOPY;  Service: Gastroenterology;  Laterality: N/A;   INTRAUTERINE DEVICE (IUD) INSERTION  2016   Mirena    WISDOM TOOTH EXTRACTION      Prior to Admission medications   Medication Sig Start Date End Date Taking? Authorizing Provider  cetirizine (ZYRTEC) 10 MG tablet Take 10 mg by mouth daily.    [provider]  leflunomide (ARAVA) 10 MG tablet Take 1 tablet by mouth daily. 04/06/22   [provider]  levonorgestrel (MIRENA) 20 MCG/24HR IUD 1 Intra Uterine Device (1 each total) by Intrauterine route once. 01/12/15   Romualdo Bolk, MD  omeprazole (PRILOSEC) 20 MG capsule Take 20 mg by mouth daily.    [provider]  SIMPONI 50 MG/0.5ML SOAJ Inject into the skin.    [provider]    Allergies as of 05/28/2023 - Review Complete 03/26/2023  Allergen Reaction Noted   Amoxicillin Rash 01/05/2015   Enbrel [etanercept] Hives 01/05/2015    Family History  Problem Relation Age of Onset   Cancer Mother 56       gallbladder   Colon cancer Father 32   Breast cancer  Sister 52   Seizures Paternal Uncle    Breast cancer Maternal Grandmother 50   Heart disease Maternal Grandfather    Lupus Paternal Grandmother    Colon cancer Paternal Grandfather 14   Breast cancer Other 79       MGM's mother   Colon cancer Other 11       PGF;s mother    Social History   Socioeconomic History   Marital status: Single    Spouse name: Not on file   Number of children: Not on file   Years of education: Not on file   Highest education level: Not on file  Occupational History   Occupation: physician  Tobacco Use   Smoking status: Never   Smokeless tobacco: Never  Vaping Use   Vaping status: Never Used  Substance and Sexual Activity   Alcohol use: Yes    Comment: 2-3   Drug use: No   Sexual activity: Yes    Partners: Male    Birth control/protection: I.U.D.    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 General:   Alert,  pleasant and cooperative in NAD Head:  Normocephalic and atraumatic. Neck:  Supple; no masses or thyromegaly. Lungs:  Clear throughout to auscultation, normal respiratory effort.    Heart:  +S1, +S2, Regular rate and rhythm, No edema. Abdomen:  Soft, nontender and nondistended. Normal bowel sounds, without guarding, and without rebound.   Neurologic:  Alert and  oriented x4;  grossly normal neurologically.  Impression/Plan: Gershon Crane, Dr. is here for an colonoscopy to be performed for family  hiostory of colon cancer Risks, benefits, limitations, and alternatives regarding   colonoscopy have been reviewed with the patient.  Questions have been answered.  All parties agreeable.   Wyline Mood, MD  07/08/2023, 8:39 AM

## 2023-07-08 NOTE — Op Note (Signed)
 [facility] Gastroenterology Patient Name: Elizabeth Swanson Procedure Date: 07/08/2023 9:20 AM MRN: [id number] Account #: [id number] Date of Birth: [birth year] Admit Type: Outpatient Age: 48 Room: [facility] ROOM 1 Gender: Female Note Status: Finalized Instrument Name: Prentice Docker [id number] Procedure:             Colonoscopy Indications:           Screening in patient at increased risk: Colorectal                         cancer in father before age 57 Providers:             Wyline Mood MD, MD Referring MD:          Wyline Mood MD, MD (Referring MD) Medicines:             Monitored Anesthesia Care Complications:         No immediate complications. Procedure:             Pre-Anesthesia Assessment:                        - Prior to the procedure, a History and Physical was                         performed, and patient medications, allergies and                         sensitivities were reviewed. The patient's tolerance                         of previous anesthesia was reviewed.                        - The risks and benefits of the procedure and the                         sedation options and risks were discussed with the                         patient. All questions were answered and informed                         consent was obtained.                        - ASA Grade Assessment: II - A patient with mild                         systemic disease.                        After obtaining informed consent, the colonoscope was                         passed under direct vision. Throughout the procedure,                         the patient's blood pressure, pulse, and oxygen  saturations were monitored continuously. The                         Colonoscope was introduced through the anus and                         advanced to the the cecum, identified by the                         appendiceal orifice. The colonoscopy was performed                          with ease. The patient tolerated the procedure well.                         The quality of the bowel preparation was excellent.                         The ileocecal valve, appendiceal orifice, and rectum                         were photographed. Findings:      The perianal and digital rectal examinations were normal.      The entire examined colon appeared normal on direct and retroflexion       views. Impression:            - The entire examined colon is normal on direct and                         retroflexion views.                        - No specimens collected. Recommendation:        - Discharge patient to home (with escort).                        - Resume previous diet.                        - Continue present medications.                        - Repeat colonoscopy in 5 years for screening purposes. Procedure Code(s):     --- Professional ---                        [phone], Colonoscopy, flexible; diagnostic, including                         collection of specimen(s) by brushing or washing, when                         performed (separate procedure) Diagnosis Code(s):     --- Professional ---                        Z80.0, Family history of malignant neoplasm of                         digestive organs CPT copyright 2022 American Medical Association. All  rights reserved. The codes documented in this report are preliminary and upon coder review may  be revised to meet current compliance requirements. Wyline Mood, MD Wyline Mood MD, MD 07/08/2023 9:44:40 AM This report has been signed electronically. Number of Addenda: 0 Note Initiated On: 07/08/2023 9:20 AM Scope Withdrawal Time: 0 hours 7 minutes 49 seconds  Total Procedure Duration: 0 hours 11 minutes 34 seconds  Estimated Blood Loss:  Estimated blood loss: none.      [facility]

## 2023-07-08 NOTE — Addendum Note (Signed)
 Addendum  created 07/08/23 1101 by Lysbeth Penner, CRNA   Flowsheet accepted

## 2023-07-09 ENCOUNTER — Encounter: Payer: Self-pay | Admitting: Gastroenterology

## 2024-05-14 ENCOUNTER — Encounter: Payer: Self-pay | Admitting: Obstetrics and Gynecology

## 2024-05-14 ENCOUNTER — Other Ambulatory Visit (HOSPITAL_COMMUNITY)
Admission: RE | Admit: 2024-05-14 | Discharge: 2024-05-14 | Disposition: A | Source: Ambulatory Visit | Attending: Obstetrics and Gynecology | Admitting: Obstetrics and Gynecology

## 2024-05-14 ENCOUNTER — Ambulatory Visit (INDEPENDENT_AMBULATORY_CARE_PROVIDER_SITE_OTHER): Payer: PRIVATE HEALTH INSURANCE | Admitting: Obstetrics and Gynecology

## 2024-05-14 VITALS — BP 126/82 | HR 94 | Ht 67.0 in | Wt 192.0 lb

## 2024-05-14 DIAGNOSIS — E2839 Other primary ovarian failure: Secondary | ICD-10-CM

## 2024-05-14 DIAGNOSIS — M05741 Rheumatoid arthritis with rheumatoid factor of right hand without organ or systems involvement: Secondary | ICD-10-CM

## 2024-05-14 DIAGNOSIS — Z8742 Personal history of other diseases of the female genital tract: Secondary | ICD-10-CM

## 2024-05-14 DIAGNOSIS — Z1331 Encounter for screening for depression: Secondary | ICD-10-CM

## 2024-05-14 DIAGNOSIS — Z872 Personal history of diseases of the skin and subcutaneous tissue: Secondary | ICD-10-CM | POA: Diagnosis present

## 2024-05-14 DIAGNOSIS — M05742 Rheumatoid arthritis with rheumatoid factor of left hand without organ or systems involvement: Secondary | ICD-10-CM | POA: Diagnosis present

## 2024-05-14 DIAGNOSIS — Z01419 Encounter for gynecological examination (general) (routine) without abnormal findings: Secondary | ICD-10-CM | POA: Insufficient documentation

## 2024-05-14 NOTE — Progress Notes (Signed)
 "   49 y.o. y.o. female here for annual exam. No LMP recorded. (Menstrual status: IUD).    y.o. female here for annual exam.  Reports some hot flashes noted when she wakes up H/o mirena  removed 03/26/2023 and new one placed.  Pleased with it. Some hot flashes Left labial lesion with itching   No LMP recorded. (Menstrual status: IUD). Period Pattern:  (spotting with iud rarely)  Health Maintenance: Mirena  IUD since 2016- to schedule removal and placement of new mirena - referral placed for new one Pap:  2024    History of abnormal Pap:  yes, many years ago, colpo was negative.  MMG: 03/25/24 BMD:   2020 [facility]. Normal. Repeat in 5 years. Risk factor with RA. No h/o fractures- referral placed Colonoscopy: 11/18, f/u in 5 years. 07/08/23 to repeat in 5 years TDaP:   up to date per patient  Gardasil: none      05/14/2024   10:37 AM 01/29/2017    2:09 PM  Depression screen PHQ 2/9  Decreased Interest 0 0  Down, Depressed, Hopeless 0 0  PHQ - 2 Score 0 0    Blood pressure 126/82, pulse 94, height 5' 7 (1.702 m), weight 192 lb (87.1 kg), SpO2 96%.     Component Value Date/Time   DIAGPAP  02/14/2023 9166    - Negative for intraepithelial lesion or malignancy (NILM)   DIAGPAP  02/03/2018 0000    NEGATIVE FOR INTRAEPITHELIAL LESIONS OR MALIGNANCY.   HPVHIGH Negative 02/14/2023 0833   ADEQPAP  02/14/2023 0833    Satisfactory for evaluation; transformation zone component ABSENT.   ADEQPAP  02/03/2018 0000    Satisfactory for evaluation  endocervical/transformation zone component PRESENT.    GYN HISTORY:    Component Value Date/Time   DIAGPAP  02/14/2023 0833    - Negative for intraepithelial lesion or malignancy (NILM)   DIAGPAP  02/03/2018 0000    NEGATIVE FOR INTRAEPITHELIAL LESIONS OR MALIGNANCY.   HPVHIGH Negative 02/14/2023 0833   ADEQPAP  02/14/2023 0833    Satisfactory for evaluation; transformation zone component ABSENT.   ADEQPAP  02/03/2018 0000    Satisfactory for  evaluation  endocervical/transformation zone component PRESENT.    OB History  Gravida Para Term Preterm AB Living  0 0 0 0 0 0  SAB IAB Ectopic Multiple Live Births  0 0 0 0 0    Past Medical History:  Diagnosis Date   Fibroid    History of migraine with aura    Ovarian cyst    Rheumatoid arteritis (HCC)     Past Surgical History:  Procedure Laterality Date   COLONOSCOPY WITH PROPOFOL  N/A 03/08/2017   Procedure: COLONOSCOPY WITH PROPOFOL ;  Surgeon: Therisa Bi, MD;  Location: [facility] ENDOSCOPY;  Service: Gastroenterology;  Laterality: N/A;   COLONOSCOPY WITH PROPOFOL  N/A 07/08/2023   Procedure: COLONOSCOPY WITH PROPOFOL ;  Surgeon: Therisa Bi, MD;  Location: [facility] ENDOSCOPY;  Service: Gastroenterology;  Laterality: N/A;   COLPOSCOPY     ESOPHAGOGASTRODUODENOSCOPY (EGD) WITH PROPOFOL  N/A 03/08/2017   Procedure: ESOPHAGOGASTRODUODENOSCOPY (EGD) WITH PROPOFOL ;  Surgeon: Therisa Bi, MD;  Location: [facility] ENDOSCOPY;  Service: Gastroenterology;  Laterality: N/A;   INTRAUTERINE DEVICE (IUD) INSERTION  2016   Mirena     WISDOM TOOTH EXTRACTION      Medications Ordered Prior to Encounter[1]  Social History   Socioeconomic History   Marital status: Single    Spouse name: Not on file   Number of children: Not on file   Years of education: Not  on file   Highest education level: Not on file  Occupational History   Occupation: physician  Tobacco Use   Smoking status: Never   Smokeless tobacco: Never  Vaping Use   Vaping status: Never Used  Substance and Sexual Activity   Alcohol use: Yes    Comment: 2-3   Drug use: No   Sexual activity: Yes    Partners: Male    Birth control/protection: I.U.D.    Comment: mirena   Other Topics Concern   Not on file  Social History Narrative   The patient is an ER MD for [employer]   Social Drivers of Health   Tobacco Use: Low Risk (05/14/2024)   Patient History    Smoking Tobacco Use: Never    Smokeless Tobacco Use: Never    Passive Exposure: Not  on file  Financial Resource Strain: Not on file  Food Insecurity: Not on file  Transportation Needs: Not on file  Physical Activity: Not on file  Stress: Not on file  Social Connections: Not on file  Intimate Partner Violence: Not on file  Depression (PHQ2-9): Low Risk (05/14/2024)   Depression (PHQ2-9)    PHQ-2 Score: 0  Alcohol Screen: Not on file  Housing: Not on file  Utilities: Not on file  Health Literacy: Not on file    Family History  Problem Relation Age of Onset   Cancer Mother 57       gallbladder   Colon cancer Father 35   Breast cancer Sister 12   Seizures Paternal Uncle    Breast cancer Maternal Grandmother 50   Heart disease Maternal Grandfather    Lupus Paternal Grandmother    Colon cancer Paternal Grandfather 25   Breast cancer Other 22       MGM's mother   Colon cancer Other 66       PGF;s mother     Allergies[2]    Patient's last menstrual period was No LMP recorded. (Menstrual status: IUD)..            Review of Systems Alls systems reviewed and are negative.     Physical Exam Constitutional:      Appearance: Normal appearance.  Genitourinary:     Vulva and urethral meatus normal.     No lesions in the vagina.     Right Labia: No rash, lesions or skin changes.    Left Labia: No lesions, skin changes or rash.    No vaginal discharge or tenderness.     No vaginal prolapse present.    No vaginal atrophy present.     Right Adnexa: not tender, not palpable and no mass present.    Left Adnexa: not tender, not palpable and no mass present.    No cervical motion tenderness or discharge.     IUD strings visualized.     Uterus is not enlarged, tender or irregular.  Breasts:    Right: Normal.     Left: Normal.  HENT:     Head: Normocephalic.  Neck:     Thyroid : No thyroid  mass, thyromegaly or thyroid  tenderness.  Cardiovascular:     Rate and Rhythm: Normal rate and regular rhythm.     Heart sounds: Normal heart sounds, S1 normal and S2  normal.  Pulmonary:     Effort: Pulmonary effort is normal.     Breath sounds: Normal breath sounds and air entry.  Abdominal:     General: There is no distension.     Palpations: Abdomen is soft.  There is no mass.     Tenderness: There is no abdominal tenderness. There is no guarding or rebound.  Musculoskeletal:        General: Normal range of motion.     Cervical back: Full passive range of motion without pain, normal range of motion and neck supple. No tenderness.     Right lower leg: No edema.     Left lower leg: No edema.  Neurological:     Mental Status: She is alert.  Skin:    General: Skin is warm.  Psychiatric:        Mood and Affect: Mood normal.        Behavior: Behavior normal.        Thought Content: Thought content normal.  Vitals and nursing note reviewed. Exam conducted with a chaperone present.       A:         Well Woman GYN exam             RA Mirena  IUD 11/24                P:        Pap smear collected today Encouraged annual mammogram screening Colon cancer screening up-to-date DXA ordered today Labs and immunizations to do with PMD Discussed breast self exams Encouraged healthy lifestyle practices Encouraged Vit D and Calcium   No follow-ups on file.  Elizabeth Swanson     [1]  Current Outpatient Medications on File Prior to Visit  Medication Sig Dispense Refill   cetirizine (ZYRTEC) 10 MG tablet Take 10 mg by mouth daily.     leflunomide (ARAVA) 10 MG tablet Take 1 tablet by mouth daily.     levonorgestrel  (MIRENA ) 20 MCG/24HR IUD 1 Intra Uterine Device (1 each total) by Intrauterine route once. 1 each 0   omeprazole (PRILOSEC) 20 MG capsule Take 20 mg by mouth daily.     SIMPONI 50 MG/0.5ML SOAJ Inject into the skin.     No current facility-administered medications on file prior to visit.  [2]  Allergies Allergen Reactions   Amoxicillin Rash   Enbrel [Etanercept] Hives   "

## 2024-05-14 NOTE — Patient Instructions (Signed)
 Perimenopause/Menopause suggestions   You should be getting 1,200 mg of calcium a day between your diet and supplements and at least 3000 IU a day of Vit D. You should exercise regularly with weight bearing exercises. I would recommend a bone density q 2 years.  We loose 5% per year in this time period of our bone density, due to the loss of estrogen. Magnesium at bedtime for our sleep and bones (follow recommended dose on bottle)  Please read The New Menopause my Dr. Ronal Estefana Morton and Estrogen matters  Try to avoid caffeine and alcohol in this period, as they can make symptoms worse  Continue annual mammograms, unless told sooner  Please reach out with any questions Almarie MARLA Carpen  Locations you can schedule your bone scan are:  The [facility] bone scan location scheduling line is [phone]  [facility] is [phone]  [facility] radiology [phone]   [facility] [phone]  [facility] Zelda Salmon: [phone]    Another option if they are behind is [facility] and their number is [phone].  I can send the referral if you want to go there.    Please let me know if you have any trouble scheduling. This is important for management of osteoporosis or osteopenia.   I can sit down with you after the scan to discuss results and treatment.  Dr. Carpen

## 2024-05-14 NOTE — Progress Notes (Signed)
 Itching in posterior labia area.

## 2024-05-18 ENCOUNTER — Ambulatory Visit: Payer: Self-pay | Admitting: Obstetrics and Gynecology

## 2024-05-18 LAB — CYTOLOGY - PAP
Adequacy: ABSENT
Diagnosis: NEGATIVE
